# Patient Record
Sex: Female | Born: 1995 | Race: Black or African American | Hispanic: No | Marital: Single | Smoking: Former smoker
Health system: Southern US, Community
[De-identification: ages and names within clinical notes are randomized; demographics above are authoritative.]

## PROBLEM LIST (undated history)

## (undated) DIAGNOSIS — O020 Blighted ovum and nonhydatidiform mole: Secondary | ICD-10-CM

## (undated) DIAGNOSIS — F319 Bipolar disorder, unspecified: Secondary | ICD-10-CM

## (undated) DIAGNOSIS — Z9189 Other specified personal risk factors, not elsewhere classified: Secondary | ICD-10-CM

## (undated) HISTORY — DX: Other specified personal risk factors, not elsewhere classified: Z91.89

---

## 2018-09-26 DIAGNOSIS — S80212A Abrasion, left knee, initial encounter: Secondary | ICD-10-CM | POA: Diagnosis not present

## 2018-09-26 DIAGNOSIS — S80211A Abrasion, right knee, initial encounter: Secondary | ICD-10-CM | POA: Diagnosis not present

## 2018-09-26 DIAGNOSIS — R55 Syncope and collapse: Secondary | ICD-10-CM | POA: Diagnosis not present

## 2018-09-26 DIAGNOSIS — W1839XA Other fall on same level, initial encounter: Secondary | ICD-10-CM | POA: Diagnosis not present

## 2018-09-26 DIAGNOSIS — Z23 Encounter for immunization: Secondary | ICD-10-CM | POA: Diagnosis not present

## 2018-09-26 DIAGNOSIS — Z5321 Procedure and treatment not carried out due to patient leaving prior to being seen by health care provider: Secondary | ICD-10-CM | POA: Diagnosis not present

## 2018-10-14 DIAGNOSIS — Z3202 Encounter for pregnancy test, result negative: Secondary | ICD-10-CM | POA: Diagnosis not present

## 2019-01-20 DIAGNOSIS — Z20822 Contact with and (suspected) exposure to covid-19: Secondary | ICD-10-CM | POA: Diagnosis not present

## 2019-01-20 DIAGNOSIS — Z20828 Contact with and (suspected) exposure to other viral communicable diseases: Secondary | ICD-10-CM | POA: Diagnosis not present

## 2019-09-28 ENCOUNTER — Other Ambulatory Visit: Payer: Self-pay

## 2019-09-28 ENCOUNTER — Encounter: Payer: Self-pay | Admitting: Obstetrics

## 2019-09-28 ENCOUNTER — Ambulatory Visit (INDEPENDENT_AMBULATORY_CARE_PROVIDER_SITE_OTHER): Payer: Federal, State, Local not specified - PPO

## 2019-09-28 DIAGNOSIS — Z3490 Encounter for supervision of normal pregnancy, unspecified, unspecified trimester: Secondary | ICD-10-CM | POA: Insufficient documentation

## 2019-09-28 DIAGNOSIS — Z3480 Encounter for supervision of other normal pregnancy, unspecified trimester: Secondary | ICD-10-CM | POA: Insufficient documentation

## 2019-09-28 DIAGNOSIS — N912 Amenorrhea, unspecified: Secondary | ICD-10-CM

## 2019-09-28 LAB — POCT URINE PREGNANCY: Preg Test, Ur: POSITIVE — AB

## 2019-09-28 MED ORDER — BLOOD PRESSURE MONITOR KIT: 1.0000 | PACK | 0 refills | Status: DC

## 2019-09-28 NOTE — Progress Notes (Signed)
Megan Vance presents today for UPT. She complains of nausea declined Rx . LMP:08/07/2019     OBJECTIVE: Appears well, in no apparent distress.  OB History   No obstetric history on file.    Home UPT Result: Positive took 2 at home UPT's.  In-Office UPT result: Positive  I have reviewed the patient's medical, obstetrical, social, and family histories, and medications.   ASSESSMENT: Positive pregnancy test  PLAN Prenatal care to be completed at: [facility]   B/P Cuff sent  NOB Intake Done

## 2019-09-28 NOTE — Progress Notes (Signed)
Patient was assessed and managed by nursing staff during this encounter. I have reviewed the chart and agree with the documentation and plan. I have also made any necessary editorial changes.  Catalina Antigua, MD 09/28/2019 4:28 PM

## 2019-10-05 ENCOUNTER — Other Ambulatory Visit: Payer: Self-pay

## 2019-10-05 MED ORDER — DOXYLAMINE-PYRIDOXINE 10-10 MG PO TBEC: 2.0000 | DELAYED_RELEASE_TABLET | Freq: Every evening | ORAL | 2 refills | Status: DC | PRN

## 2019-10-20 ENCOUNTER — Encounter: Payer: Medicaid Other | Admitting: Advanced Practice Midwife

## 2019-10-20 ENCOUNTER — Ambulatory Visit (INDEPENDENT_AMBULATORY_CARE_PROVIDER_SITE_OTHER): Payer: Federal, State, Local not specified - PPO | Admitting: Advanced Practice Midwife

## 2019-10-20 ENCOUNTER — Other Ambulatory Visit: Payer: Self-pay

## 2019-10-20 ENCOUNTER — Ambulatory Visit (INDEPENDENT_AMBULATORY_CARE_PROVIDER_SITE_OTHER): Payer: Federal, State, Local not specified - PPO

## 2019-10-20 VITALS — BP 127/82 | HR 86 | Wt 136.6 lb

## 2019-10-20 DIAGNOSIS — O3680X0 Pregnancy with inconclusive fetal viability, not applicable or unspecified: Secondary | ICD-10-CM | POA: Diagnosis not present

## 2019-10-20 DIAGNOSIS — O26851 Spotting complicating pregnancy, first trimester: Secondary | ICD-10-CM

## 2019-10-20 DIAGNOSIS — O3680X1 Pregnancy with inconclusive fetal viability, fetus 1: Secondary | ICD-10-CM | POA: Diagnosis not present

## 2019-10-20 DIAGNOSIS — Z3A10 10 weeks gestation of pregnancy: Secondary | ICD-10-CM

## 2019-10-20 DIAGNOSIS — O0990 Supervision of high risk pregnancy, unspecified, unspecified trimester: Secondary | ICD-10-CM | POA: Insufficient documentation

## 2019-10-20 DIAGNOSIS — O0991 Supervision of high risk pregnancy, unspecified, first trimester: Secondary | ICD-10-CM

## 2019-10-20 DIAGNOSIS — O283 Abnormal ultrasonic finding on antenatal screening of mother: Secondary | ICD-10-CM | POA: Diagnosis not present

## 2019-10-20 DIAGNOSIS — O368390 Maternal care for abnormalities of the fetal heart rate or rhythm, unspecified trimester, not applicable or unspecified: Secondary | ICD-10-CM

## 2019-10-20 DIAGNOSIS — O368391 Maternal care for abnormalities of the fetal heart rate or rhythm, unspecified trimester, fetus 1: Secondary | ICD-10-CM

## 2019-10-20 DIAGNOSIS — F340 Cyclothymic disorder: Secondary | ICD-10-CM

## 2019-10-20 DIAGNOSIS — Z3490 Encounter for supervision of normal pregnancy, unspecified, unspecified trimester: Secondary | ICD-10-CM

## 2019-10-20 NOTE — Progress Notes (Signed)
    PRENATAL VISIT NOTE  Subjective:  Megan Vance is a 24 y.o. G2P0 at [redacted]w[redacted]d being seen today for initial prenatal care.  She is currently monitored for the following issues for this high-risk pregnancy and has Supervision of normal pregnancy, antepartum on their problem list.  Patient reports light spotting x 2-3 days.   . Vag. Bleeding: Scant.  Movement: Absent. Denies leaking of fluid.   The following portions of the patient's history were reviewed and updated as appropriate: allergies, current medications, past family history, past medical history, past social history, past surgical history and problem list.   Objective:   Vitals:   10/20/19 1538  BP: 127/82  Pulse: 86  Weight: 136 lb 9.6 oz (62 kg)    Fetal Status: Fetal Heart Rate (bpm): U/S   Movement: Absent     General:  Alert, oriented and cooperative. Patient is in no acute distress.  Skin: Skin is warm and dry. No rash noted.   Cardiovascular: Normal heart rate noted  Respiratory: Normal respiratory effort, no problems with respiration noted  Abdomen: Soft, gravid, appropriate for gestational age.  Pain/Pressure: Absent     Pelvic: Cervical exam deferred        Extremities: Normal range of motion.  Edema: None  Mental Status: Normal mood and affect. Normal behavior. Normal judgment and thought content.   Assessment and Plan:  Pregnancy: G2P0 at [redacted]w[redacted]d 1. Encounter to determine fetal viability of pregnancy, single or unspecified fetus - US OB Limited; Future - CBC - Comprehensive metabolic panel - Beta hCG quant (ref lab)  2. Unable to hear fetal heart tones as reason for ultrasound scan - US OB Limited; Future  3. Abnormal fetal ultrasound --Unable to hear FHT so US performed in office. --Multiple cystic structures without evidence of viable IUP noted. Dr Gerri Spore to bedside during [place].  Likely molar pregnancy, follow up US to confirm. - US OB LESS THAN 14 WEEKS WITH OB TRANSVAGINAL; Future  5. Supervision of  high risk pregnancy, antepartum --Pt to f/u with [place] and appt in office with MD to review [place], make plan for treatment. --Questions answered.  6. Bipolar disorder, current condition not specified as either manic or depressive --Pt reports bipolar diagnosed as a child, and has not required meds for many years. But during pregnancy, her symptoms have worsened and she has missed work.  She is seeking help today and wants to speak with our counselor and better manage her symptoms. --I will support her missed work since the early part of the pregnancy and will sign for her to be out until her appt with MD next week and decisions are made about the pregnancy and treatment.   --PHQ9 was not performed as patient reported spotting and was sent to [place] before complete assessment.  After likely molar pregnancy was diagnosed, she did not complete full New OB visit today.  She did deny any risks of harm to herself or others. She denies any suicidal thoughts.  - Ambulatory referral to Integrated Behavioral Health  Miscarriage precautions and general obstetric precautions including but not limited to vaginal bleeding, contractions, leaking of fluid and fetal movement were reviewed in detail with the patient. Please refer to After Visit Summary for other counseling recommendations.   No follow-ups on file.  Future Appointments  Date Time Provider Department Center  10/21/2019  9:00 AM [facility]  10/26/2019  1:00 PM Gwyndolyn Saxon, LCSW [facility] None    Sharen Counter, CNM

## 2019-10-20 NOTE — Progress Notes (Signed)
NOB  NOB Intake completed 09/28/19 Last GTX:MIWO than 3 yrs no hx of abnormal   Genetic Screening:Desires and wants to know gender  EH:[id number] for a few days pt states blood is bright red.Went and discussed in office U/S w/ RN and provider to be done immediately   Pt needs to discuss accomodation paper work brought forms today. Pt has been home a few days due to personal stressors. Pt notes being Bi-polar and lately annoyed and frustrated and would like to be able to have resources of someone to speak to regarding this.Did not get to do depression screening due to getting pt to U/S instead.   Needs Rx for Nausea.

## 2019-10-21 ENCOUNTER — Ambulatory Visit
Admission: RE | Admit: 2019-10-21 | Discharge: 2019-10-21 | Disposition: A | Discharge status: Home / Self Care | Payer: Federal, State, Local not specified - PPO | Source: Ambulatory Visit | Attending: Advanced Practice Midwife | Admitting: Advanced Practice Midwife

## 2019-10-21 ENCOUNTER — Telehealth: Payer: Self-pay | Admitting: Advanced Practice Midwife

## 2019-10-21 DIAGNOSIS — N858 Other specified noninflammatory disorders of uterus: Secondary | ICD-10-CM | POA: Diagnosis not present

## 2019-10-21 DIAGNOSIS — Z3A00 Weeks of gestation of pregnancy not specified: Secondary | ICD-10-CM | POA: Diagnosis not present

## 2019-10-21 DIAGNOSIS — O283 Abnormal ultrasonic finding on antenatal screening of mother: Secondary | ICD-10-CM | POA: Diagnosis not present

## 2019-10-21 DIAGNOSIS — O26851 Spotting complicating pregnancy, first trimester: Secondary | ICD-10-CM | POA: Diagnosis not present

## 2019-10-21 DIAGNOSIS — R9389 Abnormal findings on diagnostic imaging of other specified body structures: Secondary | ICD-10-CM | POA: Diagnosis not present

## 2019-10-21 LAB — CBC
Hematocrit: 38.6 % (ref 34.0–46.6)
Hemoglobin: 12.9 g/dL (ref 11.1–15.9)
MCH: 30.7 pg (ref 26.6–33.0)
MCHC: 33.4 g/dL (ref 31.5–35.7)
MCV: 92 fL (ref 79–97)
Platelets: 223 10*3/uL (ref 150–450)
RBC: 4.2 x10E6/uL (ref 3.77–5.28)
RDW: 12.9 % (ref 11.7–15.4)
WBC: 8.6 10*3/uL (ref 3.4–10.8)

## 2019-10-21 LAB — COMPREHENSIVE METABOLIC PANEL
ALT: 38 IU/L — ABNORMAL HIGH (ref 0–32)
AST: 37 IU/L (ref 0–40)
Albumin/Globulin Ratio: 1.9 (ref 1.2–2.2)
Albumin: 4.6 g/dL (ref 3.9–5.0)
Alkaline Phosphatase: 50 IU/L (ref 44–121)
BUN/Creatinine Ratio: 7 — ABNORMAL LOW (ref 9–23)
BUN: 5 mg/dL — ABNORMAL LOW (ref 6–20)
Bilirubin Total: 0.2 mg/dL (ref 0.0–1.2)
CO2: 20 mmol/L (ref 20–29)
Calcium: 10 mg/dL (ref 8.7–10.2)
Chloride: 103 mmol/L (ref 96–106)
Creatinine, Ser: 0.67 mg/dL (ref 0.57–1.00)
GFR calc Af Amer: 142 mL/min/{1.73_m2} (ref >59)
GFR calc non Af Amer: 123 mL/min/{1.73_m2} (ref >59)
Globulin, Total: 2.4 g/dL (ref 1.5–4.5)
Glucose: 96 mg/dL (ref 65–99)
Potassium: 4 mmol/L (ref 3.5–5.2)
Sodium: 137 mmol/L (ref 134–144)
Total Protein: 7 g/dL (ref 6.0–8.5)

## 2019-10-21 LAB — BETA HCG QUANT (REF LAB): hCG Quant: 271571 m[IU]/mL

## 2019-10-21 NOTE — Telephone Encounter (Signed)
Called pt to review [place] results from 10/21/19 confirming molar pregnancy.  Dr Gerri Spore saw pt in office on 10/5 and discussed likely diagnosis and need for surgical management. Pt was given opportunity to ask questions at that time.  Appt with MD was made for next available date, which is 10/26/19 with Dr Debroah Loop.  Consult with Drs Debroah Loop and Constant, who recommend discussing results with pt via telephone and scheduling D&E procedure now, instead of waiting until appt 10/11 to schedule.    Discussed US findings with patient via telephone today and questions answered.  Pt agrees with plan of care to schedule surgical procedure. She will keep appt with Dr Debroah Loop on 10/26/19 if surgery is scheduled after that date.  If pt has any further questions, she was instructed to contact [facility] office.  Bleeding/emergency precautions/reasons to go to MAU were reviewed.

## 2019-10-26 ENCOUNTER — Telehealth: Payer: Self-pay

## 2019-10-26 ENCOUNTER — Other Ambulatory Visit: Payer: Self-pay | Admitting: Obstetrics & Gynecology

## 2019-10-26 ENCOUNTER — Encounter (HOSPITAL_BASED_OUTPATIENT_CLINIC_OR_DEPARTMENT_OTHER): Payer: Self-pay | Admitting: Obstetrics & Gynecology

## 2019-10-26 ENCOUNTER — Ambulatory Visit (INDEPENDENT_AMBULATORY_CARE_PROVIDER_SITE_OTHER): Payer: Federal, State, Local not specified - PPO | Admitting: Licensed Clinical Social Worker

## 2019-10-26 ENCOUNTER — Other Ambulatory Visit (HOSPITAL_COMMUNITY): Payer: Self-pay | Admitting: Obstetrics & Gynecology

## 2019-10-26 DIAGNOSIS — Z8659 Personal history of other mental and behavioral disorders: Secondary | ICD-10-CM | POA: Diagnosis not present

## 2019-10-26 DIAGNOSIS — O020 Blighted ovum and nonhydatidiform mole: Secondary | ICD-10-CM

## 2019-10-26 NOTE — BH Specialist Note (Signed)
Integrated Behavioral Health via Telemedicine Video (Caregility) Visit  10/26/2019 Joylyn Duggin [id number]  Number of Integrated Behavioral Health visits: 1 Session Start time: 1:05pm  Session End time: 1:42pm Total time: 37 minutes via mychart   Referring Provider: Sharen Counter CNM Type of Service: Individual Patient/Family location: Home  [facility] Provider location: [facility]  All persons participating in visit: Pt Q. Homero Fellers and LCSWA A. Felton Clinton    I connected with Victorino Dike and/or Laverta Baltimore n/a by a video enabled telemedicine application (Caregility) and verified that I am speaking with the correct person using two identifiers.   Discussed confidentiality: yes  Confirmed demographics & insurance:  no  I discussed that engaging in this virtual visit, they consent to the provision of behavioral healthcare and the services will be billed under their insurance.   Patient and/or legal guardian expressed understanding and consented to virtual visit: yes  PRESENTING CONCERNS: Patient and/or family reports the following symptoms/concerns: history or bipolar depression and anxiety Duration of problem: approx four months; Severity of problem: mild   STRENGTHS (Protective Factors/Coping Skills): Ms. Fehrenbach is employed full time   ASSESSMENT: Patient currently experiencing depression    GOALS ADDRESSED: Patient will: 1.  Reduce symptoms of: depression 2.  Increase knowledge and/or ability of:  Implement coping skills  3.  Demonstrate ability to: self manage symptoms      INTERVENTIONS: Interventions utilized:  Supportive counseling  Standardized Assessments completed & reviewed: n/a     PLAN: 1. Follow up with behavioral health clinician on : three weeks via mychart  2. Behavioral recommendations: develop a self care plan, practice mindfulness technique three days a week for minimum of 20 mins .  3. Referral(s): n/a   I discussed the assessment and treatment plan  with the patient and/or parent/guardian. They were provided an opportunity to ask questions and all were answered. They agreed with the plan and demonstrated an understanding of the instructions.   They were advised to call back or seek an in-person evaluation as appropriate.  I discussed that the purpose of this visit is to provide behavioral health care while limiting exposure to the novel coronavirus.  Discussed there is a possibility of technology failure and discussed alternative modes of communication if that failure occurs.  Gwyndolyn Saxon

## 2019-10-26 NOTE — Telephone Encounter (Signed)
Called and spoke with Ms. Megan Vance. Surgery date time and location given. Pre-op instructions given  Surgery 10/28/2019 at 10:00am, arrive at 8:30am at [facility]. NPO after midnight, no lotions, no powder, no perfume, little bit of deodorant okay. Advised to removed all jewelry. Expressed concern about tongue and nose ring, advised to remove them prior to surgery due to the use on anesthesia.  Covid testing instructions given. Arrive [facility] tomorrow between 9a-3p.  Patient expressed understanding, questions answered. Advised to call our office should the need arises.

## 2019-10-27 ENCOUNTER — Other Ambulatory Visit: Payer: Self-pay

## 2019-10-27 ENCOUNTER — Encounter (HOSPITAL_BASED_OUTPATIENT_CLINIC_OR_DEPARTMENT_OTHER): Payer: Self-pay | Admitting: Obstetrics & Gynecology

## 2019-10-27 ENCOUNTER — Other Ambulatory Visit (HOSPITAL_COMMUNITY)
Admission: RE | Admit: 2019-10-27 | Discharge: 2019-10-27 | Disposition: A | Discharge status: Home / Self Care | Payer: Federal, State, Local not specified - PPO | Source: Ambulatory Visit | Attending: Obstetrics & Gynecology | Admitting: Obstetrics & Gynecology

## 2019-10-27 DIAGNOSIS — Z01812 Encounter for preprocedural laboratory examination: Secondary | ICD-10-CM | POA: Diagnosis not present

## 2019-10-27 DIAGNOSIS — Z20822 Contact with and (suspected) exposure to covid-19: Secondary | ICD-10-CM | POA: Diagnosis not present

## 2019-10-27 LAB — SARS CORONAVIRUS 2 (TAT 6-24 HRS): SARS Coronavirus 2: NEGATIVE

## 2019-10-28 ENCOUNTER — Telehealth: Payer: Federal, State, Local not specified - PPO | Admitting: Obstetrics & Gynecology

## 2019-10-28 ENCOUNTER — Other Ambulatory Visit: Payer: Self-pay

## 2019-10-28 ENCOUNTER — Encounter (HOSPITAL_BASED_OUTPATIENT_CLINIC_OR_DEPARTMENT_OTHER)
Admission: RE | Disposition: A | Discharge status: Home / Self Care | Payer: Self-pay | Source: Home / Self Care | Attending: Obstetrics & Gynecology

## 2019-10-28 ENCOUNTER — Ambulatory Visit (HOSPITAL_BASED_OUTPATIENT_CLINIC_OR_DEPARTMENT_OTHER): Payer: Federal, State, Local not specified - PPO | Admitting: Anesthesiology

## 2019-10-28 ENCOUNTER — Ambulatory Visit (HOSPITAL_COMMUNITY)
Admission: RE | Admit: 2019-10-28 | Discharge: 2019-10-28 | Disposition: A | Discharge status: Home / Self Care | Payer: Federal, State, Local not specified - PPO | Source: Ambulatory Visit | Attending: Obstetrics & Gynecology | Admitting: Obstetrics & Gynecology

## 2019-10-28 ENCOUNTER — Encounter (HOSPITAL_BASED_OUTPATIENT_CLINIC_OR_DEPARTMENT_OTHER): Payer: Self-pay | Admitting: Obstetrics & Gynecology

## 2019-10-28 ENCOUNTER — Ambulatory Visit (HOSPITAL_BASED_OUTPATIENT_CLINIC_OR_DEPARTMENT_OTHER)
Admission: RE | Admit: 2019-10-28 | Discharge: 2019-10-28 | Disposition: A | Discharge status: Home / Self Care | Payer: Federal, State, Local not specified - PPO | Attending: Obstetrics & Gynecology | Admitting: Obstetrics & Gynecology

## 2019-10-28 DIAGNOSIS — O020 Blighted ovum and nonhydatidiform mole: Secondary | ICD-10-CM

## 2019-10-28 DIAGNOSIS — Z3A11 11 weeks gestation of pregnancy: Secondary | ICD-10-CM | POA: Insufficient documentation

## 2019-10-28 DIAGNOSIS — O019 Hydatidiform mole, unspecified: Secondary | ICD-10-CM | POA: Diagnosis not present

## 2019-10-28 DIAGNOSIS — Z87891 Personal history of nicotine dependence: Secondary | ICD-10-CM | POA: Diagnosis not present

## 2019-10-28 HISTORY — DX: Bipolar disorder, unspecified: F31.9

## 2019-10-28 HISTORY — PX: OPERATIVE ULTRASOUND: SHX5996

## 2019-10-28 HISTORY — DX: Blighted ovum and nonhydatidiform mole: O02.0

## 2019-10-28 HISTORY — PX: DILATION AND EVACUATION: SHX1459

## 2019-10-28 LAB — POCT HEMOGLOBIN-HEMACUE: Hemoglobin: 10.8 g/dL — ABNORMAL LOW (ref 12.0–15.0)

## 2019-10-28 LAB — CBC
HCT: 32.4 % — ABNORMAL LOW (ref 36.0–46.0)
Hemoglobin: 10.7 g/dL — ABNORMAL LOW (ref 12.0–15.0)
MCH: 31.4 pg (ref 26.0–34.0)
MCHC: 33 g/dL (ref 30.0–36.0)
MCV: 95 fL (ref 80.0–100.0)
Platelets: 196 10*3/uL (ref 150–400)
RBC: 3.41 MIL/uL — ABNORMAL LOW (ref 3.87–5.11)
RDW: 13.5 % (ref 11.5–15.5)
WBC: 6.6 10*3/uL (ref 4.0–10.5)
nRBC: 0 % (ref 0.0–0.2)

## 2019-10-28 LAB — TYPE AND SCREEN
ABO/RH(D): O POS
Antibody Screen: NEGATIVE

## 2019-10-28 SURGERY — DILATION AND EVACUATION, UTERUS
Anesthesia: General | Site: Uterus

## 2019-10-28 MED ORDER — DOXYCYCLINE HYCLATE 100 MG IV SOLR
200.0000 mg | INTRAVENOUS | Status: AC
  Administered 2019-10-28: 200 mg via INTRAVENOUS
  Filled 2019-10-28: qty 200

## 2019-10-28 MED ORDER — MIDAZOLAM HCL 2 MG/2ML IJ SOLN
INTRAMUSCULAR | Status: AC
  Filled 2019-10-28: qty 2

## 2019-10-28 MED ORDER — ONDANSETRON HCL 4 MG/2ML IJ SOLN: 4.0000 mg | Freq: Once | INTRAMUSCULAR | Status: DC | PRN

## 2019-10-28 MED ORDER — DEXAMETHASONE SODIUM PHOSPHATE 10 MG/ML IJ SOLN
INTRAMUSCULAR | Status: AC
  Filled 2019-10-28: qty 1

## 2019-10-28 MED ORDER — MIDAZOLAM HCL 5 MG/5ML IJ SOLN
INTRAMUSCULAR | Status: DC | PRN
  Administered 2019-10-28: 2 mg via INTRAVENOUS

## 2019-10-28 MED ORDER — LIDOCAINE 2% (20 MG/ML) 5 ML SYRINGE
INTRAMUSCULAR | Status: AC
  Filled 2019-10-28: qty 5

## 2019-10-28 MED ORDER — DEXAMETHASONE SODIUM PHOSPHATE 10 MG/ML IJ SOLN
INTRAMUSCULAR | Status: DC | PRN
  Administered 2019-10-28: 10 mg via INTRAVENOUS

## 2019-10-28 MED ORDER — FENTANYL CITRATE (PF) 100 MCG/2ML IJ SOLN
INTRAMUSCULAR | Status: AC
  Filled 2019-10-28: qty 2

## 2019-10-28 MED ORDER — ACETAMINOPHEN 160 MG/5ML PO SOLN: 325.0000 mg | ORAL | Status: DC | PRN

## 2019-10-28 MED ORDER — OXYCODONE HCL 5 MG PO TABS: 5.0000 mg | ORAL_TABLET | Freq: Once | ORAL | Status: DC | PRN

## 2019-10-28 MED ORDER — OXYCODONE-ACETAMINOPHEN 5-325 MG PO TABS: 1.0000 | ORAL_TABLET | Freq: Four times a day (QID) | ORAL | 0 refills | Status: DC | PRN

## 2019-10-28 MED ORDER — MEPERIDINE HCL 25 MG/ML IJ SOLN: 6.2500 mg | INTRAMUSCULAR | Status: DC | PRN

## 2019-10-28 MED ORDER — FENTANYL CITRATE (PF) 100 MCG/2ML IJ SOLN
INTRAMUSCULAR | Status: DC | PRN
Start: 2019-10-28 — End: 2019-10-28
  Administered 2019-10-28: 25 ug via INTRAVENOUS
  Administered 2019-10-28: 50 ug via INTRAVENOUS
  Administered 2019-10-28: 25 ug via INTRAVENOUS

## 2019-10-28 MED ORDER — ONDANSETRON HCL 4 MG/2ML IJ SOLN
INTRAMUSCULAR | Status: DC | PRN
  Administered 2019-10-28: 4 mg via INTRAVENOUS

## 2019-10-28 MED ORDER — PROPOFOL 10 MG/ML IV BOLUS
INTRAVENOUS | Status: AC
  Filled 2019-10-28: qty 20

## 2019-10-28 MED ORDER — POVIDONE-IODINE 10 % EX SWAB: 2.0000 "application " | Freq: Once | CUTANEOUS | Status: DC

## 2019-10-28 MED ORDER — FENTANYL CITRATE (PF) 100 MCG/2ML IJ SOLN
25.0000 ug | INTRAMUSCULAR | Status: DC | PRN
  Administered 2019-10-28 (×2): 50 ug via INTRAVENOUS

## 2019-10-28 MED ORDER — LIDOCAINE HCL (CARDIAC) PF 100 MG/5ML IV SOSY
PREFILLED_SYRINGE | INTRAVENOUS | Status: DC | PRN
  Administered 2019-10-28: 60 mg via INTRATRACHEAL

## 2019-10-28 MED ORDER — BUPIVACAINE HCL (PF) 0.25 % IJ SOLN
INTRAMUSCULAR | Status: AC
  Filled 2019-10-28: qty 30

## 2019-10-28 MED ORDER — EPHEDRINE SULFATE 50 MG/ML IJ SOLN
INTRAMUSCULAR | Status: DC | PRN
  Administered 2019-10-28: 10 mg via INTRAVENOUS

## 2019-10-28 MED ORDER — ACETAMINOPHEN 325 MG PO TABS: 325.0000 mg | ORAL_TABLET | ORAL | Status: DC | PRN

## 2019-10-28 MED ORDER — ONDANSETRON HCL 4 MG/2ML IJ SOLN
INTRAMUSCULAR | Status: AC
  Filled 2019-10-28: qty 2

## 2019-10-28 MED ORDER — OXYTOCIN 10 UNIT/ML IJ SOLN
INTRAMUSCULAR | Status: AC
  Filled 2019-10-28: qty 2

## 2019-10-28 MED ORDER — OXYCODONE HCL 5 MG/5ML PO SOLN: 5.0000 mg | Freq: Once | ORAL | Status: DC | PRN

## 2019-10-28 MED ORDER — LACTATED RINGERS IV SOLN: INTRAVENOUS | Status: DC

## 2019-10-28 MED ORDER — FERROUS SULFATE 325 (65 FE) MG PO TABS: 325.0000 mg | ORAL_TABLET | Freq: Every day | ORAL | 3 refills | Status: DC

## 2019-10-28 MED ORDER — PROPOFOL 10 MG/ML IV BOLUS
INTRAVENOUS | Status: DC | PRN
  Administered 2019-10-28: 50 mg via INTRAVENOUS
  Administered 2019-10-28: 150 mg via INTRAVENOUS

## 2019-10-28 MED ORDER — FENTANYL CITRATE (PF) 100 MCG/2ML IJ SOLN
INTRAMUSCULAR | Status: AC
Start: 2019-10-28 — End: ?
  Filled 2019-10-28: qty 2

## 2019-10-28 MED ORDER — OXYTOCIN 10 UNIT/ML IJ SOLN
INTRAMUSCULAR | Status: DC | PRN
  Administered 2019-10-28: 20 [IU] via INTRAMUSCULAR

## 2019-10-28 MED ORDER — SODIUM CHLORIDE 0.9 % IV SOLN
INTRAVENOUS | Status: AC
  Filled 2019-10-28 (×2): qty 100

## 2019-10-28 MED ORDER — BUPIVACAINE HCL (PF) 0.25 % IJ SOLN
INTRAMUSCULAR | Status: DC | PRN
  Administered 2019-10-28: 10 mL

## 2019-10-28 SURGICAL SUPPLY — 24 items
CATH ROBINSON RED A/P 14FR (CATHETERS) IMPLANT
DECANTER SPIKE VIAL GLASS SM (MISCELLANEOUS) IMPLANT
FILTER UTR ASPR ASSEMBLY (MISCELLANEOUS) IMPLANT
GAUZE 4X4 16PLY RFD (DISPOSABLE) ×2 IMPLANT
GLOVE BIO SURGEON STRL SZ 6.5 (GLOVE) ×2 IMPLANT
GLOVE BIOGEL PI IND STRL 7.0 (GLOVE) ×2 IMPLANT
GLOVE BIOGEL PI INDICATOR 7.0 (GLOVE) ×2
GOWN STRL REUS W/ TWL LRG LVL3 (GOWN DISPOSABLE) ×1 IMPLANT
GOWN STRL REUS W/TWL LRG LVL3 (GOWN DISPOSABLE) ×5 IMPLANT
HIBICLENS CHG 4% 4OZ BTL (MISCELLANEOUS) IMPLANT
HOSE CONNECTING 18IN BERKELEY (TUBING) IMPLANT
KIT BERKELEY 1ST TRI 3/8 NO TR (MISCELLANEOUS) ×2 IMPLANT
KIT BERKELEY 1ST TRIMESTER 3/8 (MISCELLANEOUS) ×2 IMPLANT
NS IRRIG 1000ML POUR BTL (IV SOLUTION) ×2 IMPLANT
PACK VAGINAL MINOR WOMEN LF (CUSTOM PROCEDURE TRAY) ×2 IMPLANT
PAD OB MATERNITY 4.3X12.25 (PERSONAL CARE ITEMS) ×2 IMPLANT
PAD PREP 24X48 CUFFED NSTRL (MISCELLANEOUS) ×2 IMPLANT
SET BERKELEY SUCTION TUBING (SUCTIONS) ×2 IMPLANT
SLEEVE SCD COMPRESS KNEE MED (MISCELLANEOUS) ×2 IMPLANT
TOWEL GREEN STERILE FF (TOWEL DISPOSABLE) ×2 IMPLANT
VACURETTE 10 RIGID CVD (CANNULA) ×2 IMPLANT
VACURETTE 7MM CVD STRL WRAP (CANNULA) IMPLANT
VACURETTE 8 RIGID CVD (CANNULA) IMPLANT
VACURETTE 9 RIGID CVD (CANNULA) IMPLANT

## 2019-10-28 NOTE — Anesthesia Postprocedure Evaluation (Signed)
Anesthesia Post Note  Patient: Megan Vance  Procedure(s) Performed: DILATATION AND EVACUATION (N/A Uterus) OPERATIVE ULTRASOUND (N/A Uterus)     Patient location during evaluation: PACU Anesthesia Type: General Level of consciousness: awake and alert Pain management: pain level controlled Vital Signs Assessment: post-procedure vital signs reviewed and stable Respiratory status: spontaneous breathing, nonlabored ventilation, respiratory function stable and patient connected to nasal cannula oxygen Cardiovascular status: blood pressure returned to baseline and stable Postop Assessment: no apparent nausea or vomiting Anesthetic complications: no   No complications documented.  Last Vitals:  Vitals:   10/28/19 1250 10/28/19 1302  BP:  110/69  Pulse: (!) 59 64  Resp: 19 20  Temp:  36.6 C  SpO2: 100% 100%    Last Pain:  Vitals:   10/28/19 1302  TempSrc: Oral  PainSc: 0-No pain                 Wilborn Membreno

## 2019-10-28 NOTE — Transfer of Care (Signed)
Immediate Anesthesia Transfer of Care Note  Patient: Megan Vance  Procedure(s) Performed: DILATATION AND EVACUATION (N/A Uterus) OPERATIVE ULTRASOUND (N/A Uterus)  Patient Location: PACU  Anesthesia Type:General  Level of Consciousness: drowsy, patient cooperative and responds to stimulation  Airway & Oxygen Therapy: Patient Spontanous Breathing and Patient connected to face mask oxygen  Post-op Assessment: Report given to RN and Post -op Vital signs reviewed and stable  Post vital signs: Reviewed and stable  Last Vitals:  Vitals Value Taken Time  BP 133/68 10/28/19 1111  Temp    Pulse 99 10/28/19 1114  Resp 22 10/28/19 1114  SpO2 100 % 10/28/19 1114  Vitals shown include unvalidated device data.  Last Pain:  Vitals:   10/28/19 0945  TempSrc: Oral  PainSc: 0-No pain      Patients Stated Pain Goal: 4 (10/28/19 0945)  Complications: No complications documented.

## 2019-10-28 NOTE — Op Note (Signed)
Megan Vance PROCEDURE DATE: 10/28/2019  PREOPERATIVE DIAGNOSIS: [redacted]w[redacted]d Molar pregnancy POSTOPERATIVE DIAGNOSIS: The same PROCEDURE:     Dilation and Evacuation SURGEON:  Scheryl Darter MD  INDICATIONS: 24 y.o. G2P0 with molar pregnancy at [redacted]w[redacted]d weeks gestation, needing surgical completion.  Risks of surgery were discussed with the patient including but not limited to: bleeding which may require transfusion; infection which may require antibiotics; injury to uterus or surrounding organs; need for additional procedures including laparotomy or laparoscopy; possibility of intrauterine scarring which may impair future fertility; and other postoperative/anesthesia complications. Written informed consent was obtained.    FINDINGS:  A 10 week size uterus, moderate amounts of products of conception, specimen sent to pathology.  ANESTHESIA:    Monitored intravenous sedation, paracervical block. INTRAVENOUS FLUIDS:1000 ml of LR ESTIMATED BLOOD LOSS:  900 ml. SPECIMENS:  Products of conception sent to pathology COMPLICATIONS:  None immediate.  PROCEDURE DETAILS:  The patient received intravenous Doxycycline while in the preoperative area.  She was then taken to the operating room where monitored intravenous sedation was administered and was found to be adequate.  After an adequate timeout was performed, she was placed in the dorsal lithotomy position and examined; then prepped and draped in the sterile manner.   Her bladder was catheterized for an unmeasured amount of clear, yellow urine. A vaginal speculum was then placed in the patient's vagina and a single tooth tenaculum was applied to the anterior lip of the cervix.  A paracervical block using 10 ml of 0.25% Marcaine was administered. The cervix was gently dilated to accommodate a 10 mm suction curette that was gently advanced to the uterine fundus.  The suction device was then activated and curette slowly rotated to clear the uterus of products of  conception. Ultrasound imaging was used intraoperatively to assure evacuation of the cavity . There was minimal bleeding noted and the tenaculum removed with good hemostasis noted.   All instruments were removed from the patient's vagina. During curettage significant blood loss was noted but was minimal at the end. Sponge and instrument counts were correct times two  The patient tolerated the procedure well and was taken to the recovery area awake, and in stable condition.   Adam Phenix, MD 10/28/2019 11:19 AM

## 2019-10-28 NOTE — H&P (Signed)
Megan Vance is an 24 y.o. female. G2P0 Patient's last menstrual period was 08/07/2019. [redacted]w[redacted]d Patient is scheduled for dilation and evacuation of suspected molar pregnancy, as diagnosed by [place].     Menstrual History:  Patient's last menstrual period was 08/07/2019.    Past Medical History:  Diagnosis Date  . Bipolar affective disorder (HCC)   . History of fainting    since age 59   . Molar pregnancy    OB History    Gravida  2   Para      Term      Preterm      AB      Living        SAB      TAB      Ectopic      Multiple      Live Births              History reviewed. No pertinent surgical history.  Family History  Problem Relation Age of Onset  . Kidney failure Mother   . Heart attack Mother   . Heart failure Maternal Grandmother     Social History:  reports that she has quit smoking. She has never used smokeless tobacco. She reports previous alcohol use. She reports previous drug use.  Allergies: No Known Allergies  No medications prior to admission.    Review of Systems  Constitutional: Negative.   Respiratory: Negative.   Genitourinary: Positive for pelvic pain (few cramps) and vaginal bleeding (spotting).  Psychiatric/Behavioral: Positive for agitation. The patient is nervous/anxious.     Last menstrual period 08/07/2019. Physical Exam Constitutional:      Appearance: Normal appearance.  HENT:     Head: Normocephalic.  Cardiovascular:     Rate and Rhythm: Normal rate.  Pulmonary:     Effort: Pulmonary effort is normal.  Abdominal:     General: There is no distension.  Musculoskeletal:     Cervical back: Neck supple.  Skin:    General: Skin is warm and dry.  Neurological:     Mental Status: She is alert.  Psychiatric:     Comments: anxious     Results for orders placed or performed during the hospital encounter of 10/27/19 (from the past 24 hour(s))  SARS CORONAVIRUS 2 (TAT 6-24 HRS) Nasopharyngeal Nasopharyngeal Swab      Status: None   Collection Time: 10/27/19 12:54 PM   Specimen: Nasopharyngeal Swab  Result Value Ref Range   SARS Coronavirus 2 NEGATIVE NEGATIVE   CLINICAL DATA:  Vaginal spotting with markedly high beta HCG  EXAM: OBSTETRIC <14 WK [place] AND TRANSVAGINAL OB US  TECHNIQUE: Both transabdominal and transvaginal ultrasound examinations were performed for complete evaluation of the gestation as well as the maternal uterus, adnexal regions, and pelvic cul-de-sac. Transvaginal technique was performed to assess early pregnancy.  COMPARISON:  None.  FINDINGS: Intrauterine gestational sac: Not visualized  Yolk sac:  Not visualized  Embryo:  Not visualized  Cardiac Activity: Not visualized  Subchorionic hemorrhage:  None visualized.  Maternal uterus/adnexae: The endometrium is filled with inhomogeneous material with multiple small cystic areas throughout the endometrium. The endometrium measures 7.4 x 4.2 x 7.4 cm. There is increased vascularity along the periphery of this complex material expanding in filling the endometrium.  Right ovary measures 5.8 x 2.3 x 4.2 cm. Left ovary measures 3.8 x 2.6 x 1.9 cm. There are follicles in each ovary with a dominant follicle in the right ovary measuring 2.0 x 1.4 x  1.4 cm. Small amount of free pelvic fluid noted.  IMPRESSION: No intrauterine gestation. Marked thickening of the endometrium with complex material including multiple small cysts throughout the endometrium. This appearance raises concern for molar pregnancy/gestational trophoblastic disease. Appropriate clinical assessment in this regard advised.  No extrauterine pelvic or adnexal mass beyond dominant follicle right ovary. Small amount of free fluid in pelvis which could indicate recent ovarian cyst leakage or rupture.  These results will be called to the ordering clinician or representative by the Radiologist Assistant, and communication documented in the  PACS or [employer].   Electronically Signed   By: Bretta Bang III M.D.   On: 10/21/2019 10:04  CBC    Component Value Date/Time   WBC 8.6 10/20/2019 1634   RBC 4.20 10/20/2019 1634   HGB 12.9 10/20/2019 1634   HCT 38.6 10/20/2019 1634   PLT 223 10/20/2019 1634   MCV 92 10/20/2019 1634   MCH 30.7 10/20/2019 1634   MCHC 33.4 10/20/2019 1634   RDW 12.9 10/20/2019 1634   Results for KELIN, NIXON (MRN [id number]) as of 10/28/2019 09:03  Ref. Range 10/20/2019 16:34  hCG Quant Latest Units: mIU/mL 177,939    Assessment/Plan: Molar pregnancy is highly likely and pregnancy is not viable. Suction D&E with US guidance is scheduled.  The risks of surgery were discussed in detail with the patient including but not limited to: bleeding which may require transfusion or reoperation; infection which may require prolonged hospitalization or re-hospitalization and antibiotic therapy; injury to bowel, bladder, ureters and major vessels or other surrounding organs; formation of adhesions; need for additional procedures including laparotomy or subsequent procedures secondary to abnormal pathology; thromboembolic phenomenon; incisional problems and other postoperative or anesthesia complications.  Patient was told that the likelihood that her condition and symptoms will be treated effectively with this surgical management was very high; the postoperative expectations were also discussed in detail. The patient also understands the alternative treatment options which were discussed in full. All questions were answered.   Scheryl Darter 10/28/2019, 9:03 AM

## 2019-10-28 NOTE — Discharge Instructions (Addendum)
Dilation and Curettage or Vacuum Curettage, Care After These instructions give you information about caring for yourself after your procedure. Your doctor may also give you more specific instructions. Call your doctor if you have any problems or questions after your procedure. Follow these instructions at home: Activity  Do not drive or use heavy machinery while taking prescription pain medicine.  For 24 hours after your procedure, avoid driving.  Take short walks often, followed by rest periods. Ask your doctor what activities are safe for you. After one or two days, you may be able to return to your normal activities.  Do not lift anything that is heavier than 10 lb (4.5 kg) until your doctor approves.  For at least 2 weeks, or as long as told by your doctor: ? Do not douche. ? Do not use tampons. ? Do not have sex. General instructions   Take over-the-counter and prescription medicines only as told by your doctor. This is very important if you take blood thinning medicine.  Do not take baths, swim, or use a hot tub until your doctor approves. Take showers instead of baths.  Wear compression stockings as told by your doctor.  It is up to you to get the results of your procedure. Ask your doctor when your results will be ready.  Keep all follow-up visits as told by your doctor. This is important. Contact a doctor if:  You have very bad cramps that get worse or do not get better with medicine.  You have very bad pain in your belly (abdomen).  You cannot drink fluids without throwing up (vomiting).  You get pain in a different part of the area between your belly and thighs (pelvis).  You have bad-smelling discharge from your vagina.  You have a rash. Get help right away if:  You are bleeding a lot from your vagina. A lot of bleeding means soaking more than one sanitary pad in an hour, for 2 hours in a row.  You have clumps of blood (blood clots) coming from your  vagina.  You have a fever or chills.  Your belly feels very tender or hard.  You have chest pain.  You have trouble breathing.  You cough up blood.  You feel dizzy.  You feel light-headed.  You pass out (faint).  You have pain in your neck or shoulder area. Summary  Take short walks often, followed by rest periods. Ask your doctor what activities are safe for you. After one or two days, you may be able to return to your normal activities.  Do not lift anything that is heavier than 10 lb (4.5 kg) until your doctor approves.  Do not take baths, swim, or use a hot tub until your doctor approves. Take showers instead of baths.  Contact your doctor if you have any symptoms of infection, like bad-smelling discharge from your vagina. This information is not intended to replace advice given to you by your health care provider. Make sure you discuss any questions you have with your health care provider. Document Revised: 12/14/2016 Document Reviewed: 09/19/2015 Elsevier Patient Education  2020 [employer].      Post Anesthesia Home Care Instructions  Activity: Get plenty of rest for the remainder of the day. A responsible individual must stay with you for 24 hours following the procedure.  For the next 24 hours, DO NOT: -Drive a car -Advertising copywriter -Drink alcoholic beverages -Take any medication unless instructed by your physician -Make any legal decisions or  sign important papers.  Meals: Start with liquid foods such as gelatin or soup. Progress to regular foods as tolerated. Avoid greasy, spicy, heavy foods. If nausea and/or vomiting occur, drink only clear liquids until the nausea and/or vomiting subsides. Call your physician if vomiting continues.  Special Instructions/Symptoms: Your throat may feel dry or sore from the anesthesia or the breathing tube placed in your throat during surgery. If this causes discomfort, gargle with warm salt water. The discomfort should  disappear within 24 hours.    Post Anesthesia Home Care Instructions  Activity: Get plenty of rest for the remainder of the day. A responsible individual must stay with you for 24 hours following the procedure.  For the next 24 hours, DO NOT: -Drive a car -Advertising copywriter -Drink alcoholic beverages -Take any medication unless instructed by your physician -Make any legal decisions or sign important papers.  Meals: Start with liquid foods such as gelatin or soup. Progress to regular foods as tolerated. Avoid greasy, spicy, heavy foods. If nausea and/or vomiting occur, drink only clear liquids until the nausea and/or vomiting subsides. Call your physician if vomiting continues.  Special Instructions/Symptoms: Your throat may feel dry or sore from the anesthesia or the breathing tube placed in your throat during surgery. If this causes discomfort, gargle with warm salt water. The discomfort should disappear within 24 hours.  If you had a scopolamine patch placed behind your ear for the management of post- operative nausea and/or vomiting:  1. The medication in the patch is effective for 72 hours, after which it should be removed.  Wrap patch in a tissue and discard in the trash. Wash hands thoroughly with soap and water. 2. You may remove the patch earlier than 72 hours if you experience unpleasant side effects which may include dry mouth, dizziness or visual disturbances. 3. Avoid touching the patch. Wash your hands with soap and water after contact with the patch.

## 2019-10-28 NOTE — Anesthesia Procedure Notes (Signed)
Procedure Name: LMA Insertion Date/Time: 10/28/2019 10:31 AM Performed by: Thornell Mule, CRNA Pre-anesthesia Checklist: Patient identified, Emergency Drugs available, Suction available and Patient being monitored Patient Re-evaluated:Patient Re-evaluated prior to induction Oxygen Delivery Method: Circle system utilized Preoxygenation: Pre-oxygenation with 100% oxygen Induction Type: IV induction Ventilation: Mask ventilation without difficulty LMA: LMA inserted LMA Size: 4.0 Number of attempts: 1 Placement Confirmation: positive ETCO2 Tube secured with: Tape Dental Injury: Teeth and Oropharynx as per pre-operative assessment

## 2019-10-28 NOTE — Anesthesia Preprocedure Evaluation (Addendum)
Anesthesia Evaluation  Patient identified by MRN, date of birth, ID band Patient awake    Reviewed: Allergy & Precautions, H&P , NPO status , Patient's Chart, lab work & pertinent test results, reviewed documented beta blocker date and time   Airway Mallampati: I  TM Distance: >3 FB Neck ROM: full    Dental no notable dental hx. (+) Teeth Intact, Dental Advisory Given   Pulmonary former smoker,    Pulmonary exam normal breath sounds clear to auscultation       Cardiovascular Exercise Tolerance: Good negative cardio ROS   Rhythm:regular Rate:Normal     Neuro/Psych PSYCHIATRIC DISORDERS Bipolar Disorder negative neurological ROS     GI/Hepatic negative GI ROS, Neg liver ROS,   Endo/Other  negative endocrine ROS  Renal/GU negative Renal ROS  negative genitourinary   Musculoskeletal   Abdominal   Peds  Hematology negative hematology ROS (+)   Anesthesia Other Findings   Reproductive/Obstetrics negative OB ROS                            Anesthesia Physical Anesthesia Plan  ASA: II  Anesthesia Plan: General   Post-op Pain Management:    Induction: Intravenous  PONV Risk Score and Plan: 3 and Ondansetron, Dexamethasone and Treatment may vary due to age or medical condition  Airway Management Planned: LMA and Oral ETT  Additional Equipment: None  Intra-op Plan:   Post-operative Plan:   Informed Consent: I have reviewed the patients History and Physical, chart, labs and discussed the procedure including the risks, benefits and alternatives for the proposed anesthesia with the patient or authorized representative who has indicated his/her understanding and acceptance.     Dental Advisory Given  Plan Discussed with: CRNA and Anesthesiologist  Anesthesia Plan Comments: ( )        Anesthesia Quick Evaluation

## 2019-10-29 ENCOUNTER — Encounter (HOSPITAL_BASED_OUTPATIENT_CLINIC_OR_DEPARTMENT_OTHER): Payer: Self-pay | Admitting: Obstetrics & Gynecology

## 2019-10-29 LAB — SURGICAL PATHOLOGY

## 2019-11-02 ENCOUNTER — Ambulatory Visit (INDEPENDENT_AMBULATORY_CARE_PROVIDER_SITE_OTHER): Payer: Federal, State, Local not specified - PPO | Admitting: Licensed Clinical Social Worker

## 2019-11-02 DIAGNOSIS — Z8659 Personal history of other mental and behavioral disorders: Secondary | ICD-10-CM | POA: Diagnosis not present

## 2019-11-03 NOTE — BH Specialist Note (Signed)
Integrated Behavioral Health via Telemedicine Video (Caregility) Visit  11/03/2019 Megan Vance [id number]  Number of Integrated Behavioral Health visits: 2 Session Start time: 3:15pm  Session End time: 3:33pm Total time: 18 minutes via phone (pt reported trouble logging into [place])  Referring Provider: Sharen Counter  Type of Service: Individual Patient/Family location: Home  [facility] Provider location: [facility]  All persons participating in visit: Pt Megan Vance and LCSWA A. Felton Clinton    I connected with Megan Vance and/or Megan Vance n/a by a video enabled telemedicine application (Caregility) and verified that I am speaking with the correct person using two identifiers.   Discussed confidentiality: yes  Confirmed demographics & insurance:  no  I discussed that engaging in this virtual visit, they consent to the provision of behavioral healthcare and the services will be billed under their insurance.   Patient and/or legal guardian expressed understanding and consented to virtual visit: yes  PRESENTING CONCERNS: Patient and/or family reports the following symptoms/concerns: History of depression and Bipolar Disorder  Duration of problem: approx four months ; Severity of problem: Mild   STRENGTHS (Protective Factors/Coping Skills): Supportive partner Megan Vance works full time   ASSESSMENT: Patient currently experiencing depression    GOALS ADDRESSED: Patient will: 1.  Reduce symptoms of: irritability,   2.  Increase knowledge and/or ability of: implement coping skills to alleviate symptoms   3.  Demonstrate ability to: self manage symptoms    Progress of Goals: Megan Vance reports improvement in mood. Megan Vance reports she is prioritizing sleep, and started writing.   INTERVENTIONS: Interventions utilized:  Supportive counseling  Standardized Assessments completed & reviewed: n/a   PLAN: 1. Follow up with behavioral health clinician on : as needed   2. Behavioral recommendations: Continue prioritizing sleep, incorporate self care techniques, continue with mindfulness techniques  3. Referral(s): n/a  I discussed the assessment and treatment plan with the patient and/or parent/guardian. They were provided an opportunity to ask questions and all were answered. They agreed with the plan and demonstrated an understanding of the instructions.   They were advised to call back or seek an in-person evaluation as appropriate.  I discussed that the purpose of this visit is to provide behavioral health care while limiting exposure to the novel coronavirus.  Discussed there is a possibility of technology failure and discussed alternative modes of communication if that failure occurs.  Gwyndolyn Saxon

## 2019-11-11 ENCOUNTER — Other Ambulatory Visit: Payer: Self-pay

## 2019-11-11 ENCOUNTER — Other Ambulatory Visit: Payer: Federal, State, Local not specified - PPO

## 2019-11-11 DIAGNOSIS — O3680X0 Pregnancy with inconclusive fetal viability, not applicable or unspecified: Secondary | ICD-10-CM

## 2019-11-12 LAB — BETA HCG QUANT (REF LAB): hCG Quant: 810 m[IU]/mL

## 2019-11-18 ENCOUNTER — Other Ambulatory Visit: Payer: Federal, State, Local not specified - PPO

## 2019-11-19 ENCOUNTER — Other Ambulatory Visit: Payer: Federal, State, Local not specified - PPO

## 2019-11-19 ENCOUNTER — Other Ambulatory Visit: Payer: Self-pay

## 2019-11-19 DIAGNOSIS — O3680X0 Pregnancy with inconclusive fetal viability, not applicable or unspecified: Secondary | ICD-10-CM | POA: Diagnosis not present

## 2019-11-20 LAB — BETA HCG QUANT (REF LAB): hCG Quant: 200 m[IU]/mL

## 2019-11-26 ENCOUNTER — Encounter: Payer: Self-pay | Admitting: Obstetrics & Gynecology

## 2019-11-26 ENCOUNTER — Other Ambulatory Visit: Payer: Self-pay

## 2019-11-26 ENCOUNTER — Encounter: Payer: Self-pay | Admitting: Obstetrics

## 2019-11-26 ENCOUNTER — Ambulatory Visit (INDEPENDENT_AMBULATORY_CARE_PROVIDER_SITE_OTHER): Payer: Federal, State, Local not specified - PPO | Admitting: Obstetrics & Gynecology

## 2019-11-26 VITALS — BP 110/71 | HR 78 | Ht 62.0 in | Wt 142.5 lb

## 2019-11-26 DIAGNOSIS — O020 Blighted ovum and nonhydatidiform mole: Secondary | ICD-10-CM | POA: Insufficient documentation

## 2019-11-26 NOTE — Progress Notes (Signed)
Subjective:     Megan Vance is a 24 y.o. female who presents to the clinic 4 weeks status post suction D&C  for molar pregnancy. Eating a regular diet without difficulty. Bowel movements are normal. The patient is not having any pain.  The following portions of the patient's history were reviewed and updated as appropriate: allergies, current medications, past family history, past medical history, past social history, past surgical history and problem list.  Review of Systems Pertinent items are noted in HPI.    Objective:    BP 110/71   Pulse 78   Ht 5\' 2"  (1.575 m)   Wt 142 lb 8 oz (64.6 kg)   LMP 08/07/2019   Breastfeeding Unknown   BMI 26.06 kg/m  General:  alert, cooperative and no distress  Abdomen: soft, non-tender        Assessment:    Doing well postoperatively. Operative findings again reviewed. Pathology report discussed.    Plan:    1. Continue any current medications. 2. Wound care discussed. 3. Activity restrictions: none 4. Anticipated return to work: now. 5. Follow up: 2 week beta HCG, repeated today, was 200 last visit  08/09/2019, MD 11/26/2019

## 2019-11-26 NOTE — Patient Instructions (Signed)
Molar Pregnancy  A molar pregnancy (hydatidiform mole) is a mass of tissue that grows in the uterus after an egg is fertilized incorrectly. The mass does not develop into a fetus, and is considered an abnormal pregnancy. Usually, the pregnancy ends on its own through miscarriage. In some cases, treatment may be required. What are the causes? This condition is caused by an egg that is fertilized incorrectly so that it has abnormal genetic material (chromosomes). This can result in one of two types of molar pregnancies:  Complete molar pregnancy. This is when all of the chromosomes in the fertilized egg are from the father, and none are from the mother.  Partial molar pregnancy. This is when the fertilized egg has chromosomes from the father and mother, but it has too many chromosomes. What increases the risk? This condition is more likely to develop in:  Women who are over the age of 57 or under the age of 42.  Women who have had a molar pregnancy in the past (very rare). Other possible risk factors include:  Smoking more than 15 cigarettes a day.  History of infertility.  Having a blood type A, B, or AB.  Having a lack (deficiency) of vitamin A.  Using birth control pills (oral contraceptives). What are the signs or symptoms? Symptoms of this condition include:  Vaginal bleeding.  Missed menstrual period.  The uterus growing faster than expected for a normal pregnancy.  Severe nausea and vomiting.  Severe pressure or pain in the uterus.  Abnormal ovarian cysts (theca lutein cysts).  Vaginal discharge that looks like grapes.  High blood pressure (early onset of preeclampsia).  Overactive thyroid gland (hyperthyroidism).  Not having enough red blood cells or hemoglobin (anemia). How is this diagnosed? This condition is diagnosed based on ultrasound and blood tests. How is this treated? Usually, molar pregnancies end on their own by miscarriage. A health care provider  may manage this condition by:  Monitoring the levels of pregnancy hormones in your blood to make sure that the hormone levels are decreasing as expected.  Giving you a medicine called Rho (D) immune globulin. This medicine helps to prevent problems that may occur in future pregnancies as a result of a protein on red blood cells (Rh factor). You may be given this medicine if you do not have an Rh factor (you are Rh negative) and your sex partner has an Rh factor (he is Rh positive).  Putting you on chemotherapy. This involves taking medicines that regulate levels of pregnancy hormones. This may be done if your pregnancy hormone levels are not decreasing as expected.  Performing a procedure called dilation and curettage (D&C), or vacuum curettage. These are minor procedures that involve scraping or suctioning the molar pregnancy out of the uterus and removing it through the vagina. Even if a molar pregnancy ends on its own, one of these procedures may be done to make sure that all the abnormal tissue is out of the uterus.  Doing a surgical removal of the uterus (hysterectomy). Follow these instructions at home:  Avoid getting pregnant for 6-12 months, or as long as told by your health care provider. To avoid getting pregnant, avoid having sex or use a reliable form of birth control every time you have sex.  Take over-the-counter and prescription medicines only as told by your health care provider.  Rest as needed, and slowly return to your normal activities.  Think about joining a support group. If you are struggling with grief, ask  your health care provider for help.  Keep all follow-up visits as told by your health care provider. This is important. You may need follow-up blood tests or ultrasounds. Contact a health care provider if:  You continue to have irregular vaginal bleeding.  You have abdominal pain. Summary  A molar pregnancy (hydatidiform mole) is a mass of tissue that grows in  the uterus after an egg is fertilized incorrectly.  This condition is more likely to develop in women who are over the age of 76 or under the age of 45 or women who have had a molar pregnancy in the past.  The most common symptom of this condition is vaginal bleeding.  Usually, molar pregnancy ends with a miscarriage, and no treatment is needed. This information is not intended to replace advice given to you by your health care provider. Make sure you discuss any questions you have with your health care provider. Document Revised: 12/14/2016 Document Reviewed: 03/07/2016 Elsevier Patient Education  2020 [employer].

## 2019-11-27 LAB — BETA HCG QUANT (REF LAB): hCG Quant: 135 m[IU]/mL

## 2019-11-27 NOTE — Progress Notes (Signed)
Please have HCG repeated in 2 weeks

## 2019-12-09 ENCOUNTER — Other Ambulatory Visit: Payer: Federal, State, Local not specified - PPO

## 2019-12-18 ENCOUNTER — Other Ambulatory Visit: Payer: Federal, State, Local not specified - PPO

## 2019-12-18 ENCOUNTER — Other Ambulatory Visit: Payer: Self-pay

## 2019-12-18 DIAGNOSIS — O020 Blighted ovum and nonhydatidiform mole: Secondary | ICD-10-CM | POA: Diagnosis not present

## 2019-12-19 LAB — BETA HCG QUANT (REF LAB): hCG Quant: 9 m[IU]/mL

## 2019-12-22 ENCOUNTER — Telehealth: Payer: Self-pay | Admitting: *Deleted

## 2019-12-22 NOTE — Telephone Encounter (Signed)
Patient called stating no one followed up with her regarding most recent beta hcg result. Beta hcg was completed on 12/18/2019: result 9. Patient had D&C on 10/28/19 for molar pregnancy, performed by Dr. Debroah Loop. Patient followed up with Dr. Debroah Loop on 11/26/19, beta hcg: 135. Consulted with Dr. Gerri Spore, patient need repeat beta hcg and follow up with Dr. Debroah Loop.   Patient requested Friday appointment. Friday 12/25/19 offered, but declined reporting will be out of town for boyfriend's birthday. Patient has upcoming appointment on 01/01/20 at 8:55 AM for annual/PAP. Patient would like beta hcg completed at that visit. Will forward to chart to [facility] to be aware.   Clovis Pu, RN

## 2019-12-28 NOTE — Progress Notes (Signed)
Repeat HCG around 12/17

## 2019-12-30 ENCOUNTER — Telehealth: Payer: Self-pay | Admitting: Lactation Services

## 2019-12-30 NOTE — Telephone Encounter (Signed)
-----   Message from Adam Phenix, MD sent at 12/28/2019  1:43 PM EST ----- Repeat HCG around 12/17

## 2019-12-30 NOTE — Telephone Encounter (Signed)
Called patient with results of Hcg. She has an appointment on Friday for follow up Hcg and appt with Dr. Debroah Loop. Patient has no questions or concerns.

## 2020-01-01 ENCOUNTER — Ambulatory Visit (INDEPENDENT_AMBULATORY_CARE_PROVIDER_SITE_OTHER): Payer: Federal, State, Local not specified - PPO | Admitting: Obstetrics and Gynecology

## 2020-01-01 ENCOUNTER — Encounter: Payer: Self-pay | Admitting: Obstetrics and Gynecology

## 2020-01-01 ENCOUNTER — Other Ambulatory Visit (HOSPITAL_COMMUNITY)
Admission: RE | Admit: 2020-01-01 | Discharge: 2020-01-01 | Disposition: A | Discharge status: Home / Self Care | Payer: Federal, State, Local not specified - PPO | Source: Ambulatory Visit | Attending: Obstetrics and Gynecology | Admitting: Obstetrics and Gynecology

## 2020-01-01 ENCOUNTER — Other Ambulatory Visit: Payer: Self-pay

## 2020-01-01 VITALS — BP 100/65 | HR 66 | Wt 135.0 lb

## 2020-01-01 DIAGNOSIS — Z01419 Encounter for gynecological examination (general) (routine) without abnormal findings: Secondary | ICD-10-CM

## 2020-01-01 DIAGNOSIS — Z124 Encounter for screening for malignant neoplasm of cervix: Secondary | ICD-10-CM

## 2020-01-01 DIAGNOSIS — Z113 Encounter for screening for infections with a predominantly sexual mode of transmission: Secondary | ICD-10-CM

## 2020-01-01 NOTE — Progress Notes (Signed)
Patient presents for Pap and Lab work for [facility]  today per notes.   *Pt wants to discuss mammogram. Advised typically done at 40.

## 2020-01-01 NOTE — Progress Notes (Signed)
GYNECOLOGY ANNUAL PREVENTATIVE CARE ENCOUNTER NOTE  History:     Megan Vance is a 24 y.o. G2P0 female here for a routine annual gynecologic exam.  Current complaints: None. Would like full STI panel today.    Denies abnormal vaginal bleeding, discharge, pelvic pain, problems with intercourse or other gynecologic concerns.      Recent molar pregnancy- last quant was 9 Gynecologic History Patient's last menstrual period was 08/07/2019. Contraception: none Last Pap: unknown.    Obstetric History OB History  Gravida Para Term Preterm AB Living  2            SAB IAB Ectopic Multiple Live Births               # Outcome Date GA Lbr Len/2nd Weight Sex Delivery Anes PTL Lv  2 Gravida           1 Megan ([place])             Past Medical History:  Diagnosis Date  . Bipolar affective disorder (HCC)   . History of fainting    since age 34   . Molar pregnancy     Past Surgical History:  Procedure Laterality Date  . DILATION AND EVACUATION N/A 10/28/2019   Procedure: DILATATION AND EVACUATION;  Surgeon: Megan Phenix, MD;  Location: [facility];  Service: Gynecology;  Laterality: N/A;  Confirmed with Megan Vance on 10/26/19 that this patient is on the Vance schedule.  CS  . OPERATIVE ULTRASOUND N/A 10/28/2019   Procedure: OPERATIVE ULTRASOUND;  Surgeon: Megan Phenix, MD;  Location: [facility];  Service: Gynecology;  Laterality: N/A;    Current Outpatient Medications on File Prior to Visit  Medication Sig Dispense Refill  . ferrous sulfate 325 (65 FE) MG tablet Take 1 tablet (325 mg total) by mouth daily with breakfast. (Patient not taking: No sig reported) 30 tablet 3  . oxyCODONE-acetaminophen (PERCOCET/ROXICET) 5-325 MG tablet Take 1-2 tablets by mouth every 6 (six) hours as needed. (Patient not taking: No sig reported) 12 tablet 0   No current facility-administered medications on file prior to visit.    Allergies  Allergen Reactions  . Tomato  Anaphylaxis  . Pineapple Other (See Comments)    Acid reflux    Social History:  reports that she has quit smoking. She has never used smokeless tobacco. She reports previous alcohol use. She reports previous drug use.  Family History  Problem Relation Age of Onset  . Kidney failure Mother   . Heart attack Mother   . Heart failure Maternal Grandmother     The following portions of the patient's history were reviewed and updated as appropriate: allergies, current medications, past family history, past medical history, past social history, past surgical history and problem list.  Review of Systems Pertinent items noted in HPI and remainder of comprehensive ROS otherwise negative.  Physical Exam:  BP 100/65   Pulse 66   Wt 135 lb (61.2 kg)   LMP 08/07/2019   BMI 24.69 kg/m  CONSTITUTIONAL: Well-developed, well-nourished female in no acute distress.  HENT:  Normocephalic, atraumatic, External right and left ear normal.  EYES: Conjunctivae and EOM are normal. Pupils are equal, round, and reactive to light. No scleral icterus.  NECK: Normal range of motion, supple, no masses.  Normal thyroid.  SKIN: Skin is warm and dry. No rash noted. Not diaphoretic. No erythema. No pallor. MUSCULOSKELETAL: Normal range of motion. No tenderness.  No cyanosis, clubbing,  or edema.   NEUROLOGIC: Alert and oriented to person, place, and time. Normal reflexes, muscle tone coordination.  PSYCHIATRIC: Normal mood and affect. Normal behavior. Normal judgment and thought content. CARDIOVASCULAR: Normal heart rate noted, regular rhythm RESPIRATORY: Clear to auscultation bilaterally. Effort and breath sounds normal, no problems with respiration noted. BREASTS: Symmetric in size. No masses, tenderness, skin changes, nipple drainage, or lymphadenopathy bilaterally. Performed in the presence of a chaperone. ABDOMEN: Soft, no distention noted.  No tenderness, rebound or guarding.  PELVIC: Normal appearing external  genitalia and urethral meatus; normal appearing vaginal mucosa and cervix.  No abnormal discharge noted.  Pap smear obtained.  Normal uterine size, no other palpable masses, no uterine or adnexal tenderness.  Performed in the presence of a chaperone.   Assessment and Plan:    1. Screening for cervical cancer  - Cytology - PAP( [place]) - Beta hCG quant (ref lab) - Hepatitis C Antibody - HIV antibody (with reflex)  2. Women's annual routine gynecological examination  - Beta hCG quant (ref lab)- Recent Molar pregnancy  - Hepatitis C Antibody - HIV antibody (with reflex)  Will follow up results of pap smear and manage accordingly. Routine preventative health maintenance measures emphasized. Please refer to After Visit Summary for other counseling recommendations.       Anglia Blakley, Harolyn Rutherford, NP  [facility] [employer], [facility]

## 2020-01-02 LAB — BETA HCG QUANT (REF LAB): hCG Quant: 3 m[IU]/mL

## 2020-01-02 LAB — HIV ANTIBODY (ROUTINE TESTING W REFLEX): HIV Screen 4th Generation wRfx: NONREACTIVE

## 2020-01-02 LAB — HEPATITIS C ANTIBODY: Hep C Virus Ab: <0.1 s/co ratio (ref 0.0–0.9)

## 2020-01-04 ENCOUNTER — Other Ambulatory Visit: Payer: Self-pay

## 2020-01-04 ENCOUNTER — Other Ambulatory Visit (HOSPITAL_COMMUNITY)
Admission: RE | Admit: 2020-01-04 | Discharge: 2020-01-04 | Disposition: A | Discharge status: Home / Self Care | Payer: Federal, State, Local not specified - PPO | Source: Ambulatory Visit | Attending: Obstetrics and Gynecology | Admitting: Obstetrics and Gynecology

## 2020-01-04 DIAGNOSIS — Z113 Encounter for screening for infections with a predominantly sexual mode of transmission: Secondary | ICD-10-CM | POA: Insufficient documentation

## 2020-01-04 NOTE — Addendum Note (Signed)
Addended by: Kennon Portela on: 01/04/2020 09:05 AM   Modules accepted: Orders

## 2020-01-05 LAB — CERVICOVAGINAL ANCILLARY ONLY
Bacterial Vaginitis (gardnerella): POSITIVE — AB
Candida Glabrata: NEGATIVE
Candida Vaginitis: NEGATIVE
Chlamydia: NEGATIVE
Comment: NEGATIVE
Comment: NEGATIVE
Comment: NEGATIVE
Comment: NEGATIVE
Comment: NEGATIVE
Comment: NORMAL
Neisseria Gonorrhea: NEGATIVE
Trichomonas: NEGATIVE

## 2020-01-05 LAB — CYTOLOGY - PAP
Diagnosis: NEGATIVE
Diagnosis: REACTIVE

## 2020-01-06 ENCOUNTER — Other Ambulatory Visit: Payer: Self-pay | Admitting: Obstetrics and Gynecology

## 2020-01-06 MED ORDER — METRONIDAZOLE 500 MG PO TABS: 500.0000 mg | ORAL_TABLET | Freq: Two times a day (BID) | ORAL | 0 refills | Status: AC

## 2020-09-07 ENCOUNTER — Encounter: Payer: Self-pay | Admitting: Obstetrics & Gynecology

## 2020-09-07 ENCOUNTER — Other Ambulatory Visit: Payer: Self-pay

## 2020-09-07 ENCOUNTER — Ambulatory Visit (INDEPENDENT_AMBULATORY_CARE_PROVIDER_SITE_OTHER): Payer: Federal, State, Local not specified - PPO | Admitting: Obstetrics & Gynecology

## 2020-09-07 VITALS — BP 111/64 | HR 78 | Ht 62.0 in | Wt 139.0 lb

## 2020-09-07 DIAGNOSIS — O020 Blighted ovum and nonhydatidiform mole: Secondary | ICD-10-CM | POA: Diagnosis not present

## 2020-09-07 DIAGNOSIS — L739 Follicular disorder, unspecified: Secondary | ICD-10-CM | POA: Diagnosis not present

## 2020-09-07 MED ORDER — SULFAMETHOXAZOLE-TRIMETHOPRIM 800-160 MG PO TABS: 1.0000 | ORAL_TABLET | Freq: Two times a day (BID) | ORAL | 1 refills | Status: DC

## 2020-09-07 NOTE — Progress Notes (Signed)
Pt presents today with vaginal irritation. States she thinks it is ingrown hairs. States she had a wax done a few weeks ago and noticed an influx of ingrown hairs on the right side.

## 2020-09-07 NOTE — Progress Notes (Signed)
Patient ID: Megan Vance, female   DOB: July 24, 1995, 25 y.o.   MRN: [id number]  Chief Complaint  Patient presents with   Vaginal Irritation    HPI Megan Vance is a 25 y.o. female.  G1P0010 Patient's last menstrual period was 08/17/2020 (approximate). For 2 weeks she has had bumps on the right vulva after waxing. She is using hair bump cream and they are improving HPI  Past Medical History:  Diagnosis Date   Bipolar affective disorder (HCC)    History of fainting    since age 52    Molar pregnancy     Past Surgical History:  Procedure Laterality Date   DILATION AND EVACUATION N/A 10/28/2019   Procedure: DILATATION AND EVACUATION;  Surgeon: Adam Phenix, MD;  Location: [facility];  Service: Gynecology;  Laterality: N/A;  Confirmed with [name] in [place] on 10/26/19 that this patient is on the [place] schedule.  CS   OPERATIVE ULTRASOUND N/A 10/28/2019   Procedure: OPERATIVE ULTRASOUND;  Surgeon: Adam Phenix, MD;  Location: [facility];  Service: Gynecology;  Laterality: N/A;    Family History  Problem Relation Age of Onset   Kidney failure Mother    Heart attack Mother    Heart failure Maternal Grandmother     Social History Social History   Tobacco Use   Smoking status: Former   Smokeless tobacco: Never  Building services engineer Use: Never used  Substance Use Topics   Alcohol use: Not Currently    Comment: socially   Drug use: Not Currently    Allergies  Allergen Reactions   Tomato Anaphylaxis   Pineapple Other (See Comments)    Acid reflux    Current Outpatient Medications  Medication Sig Dispense Refill   sulfamethoxazole-trimethoprim (BACTRIM DS) 800-160 MG tablet Take 1 tablet by mouth 2 (two) times daily. 14 tablet 1   No current facility-administered medications for this visit.    Review of Systems Review of Systems  Constitutional: Negative.  Negative for fever.  Respiratory: Negative.    Genitourinary:  Negative for  menstrual problem, pelvic pain, vaginal discharge and vaginal pain.   Blood pressure 111/64, pulse 78, height 5\' 2"  (1.575 m), weight 139 lb (63 kg), last menstrual period 08/17/2020, unknown if currently breastfeeding.  Physical Exam Physical Exam Vitals and nursing note reviewed. Exam conducted with a chaperone present.  Constitutional:      Appearance: Normal appearance.  Pulmonary:     Effort: Pulmonary effort is normal.  Genitourinary:    Exam position: Lithotomy position.       Comments: Small follicle cysts no redness or tenderness Skin:    General: Skin is warm and dry.  Neurological:     General: No focal deficit present.     Mental Status: She is alert and oriented to person, place, and time.    Data Reviewed HCG results  Assessment Folliculitis of perineum - Plan: sulfamethoxazole-trimethoprim (BACTRIM DS) 800-160 MG tablet  Complete molar pregnancy  Last HCG <5 Plan Meds ordered this encounter  Medications   sulfamethoxazole-trimethoprim (BACTRIM DS) 800-160 MG tablet    Sig: Take 1 tablet by mouth 2 (two) times daily.    Dispense:  14 tablet    Refill:  1       10/17/2020 09/07/2020, 4:14 PM

## 2021-02-16 IMAGING — US US OB < 14 WEEKS - US OB TV
1 series · 15 of 28 positions shown · non-contrast
Comparison: None.

CLINICAL DATA: Vaginal spotting with markedly high beta HCG

EXAM:
OBSTETRIC <14 WK US AND TRANSVAGINAL OB US
TECHNIQUE: Both transabdominal and transvaginal ultrasound examinations were
performed for complete evaluation of the gestation as well as the
maternal uterus, adnexal regions, and pelvic cul-de-sac.
Transvaginal technique was performed to assess early pregnancy.

[Series 1: us ob < 14 weeks - us ob tv · 15 of 126 slices shown]
[im 1/126]
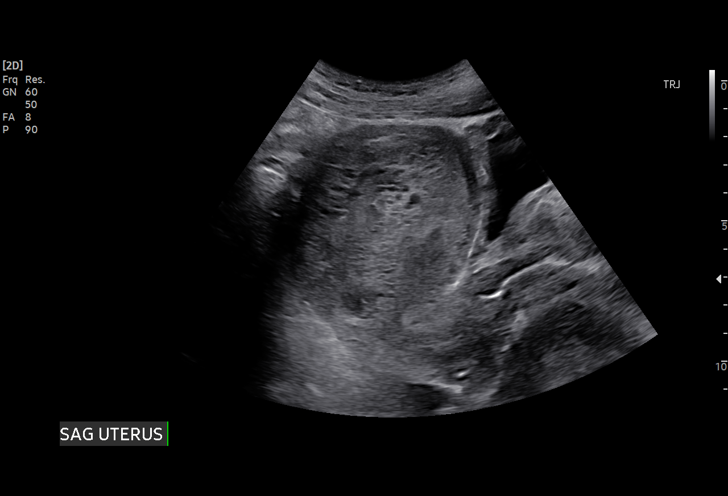
[im 10/126]
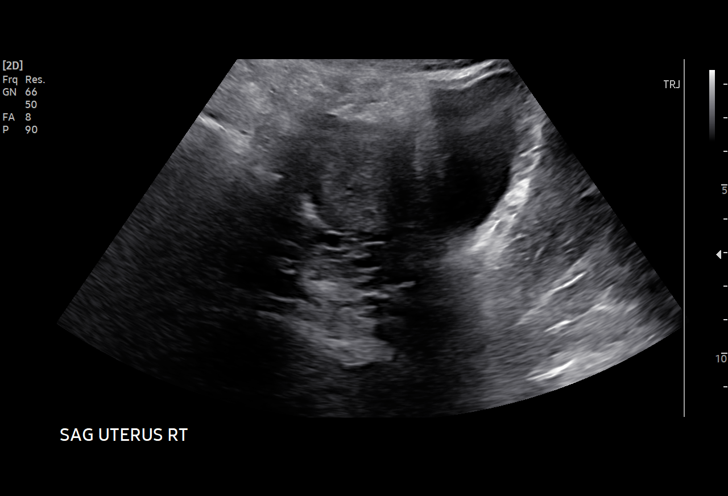
[im 19/126]
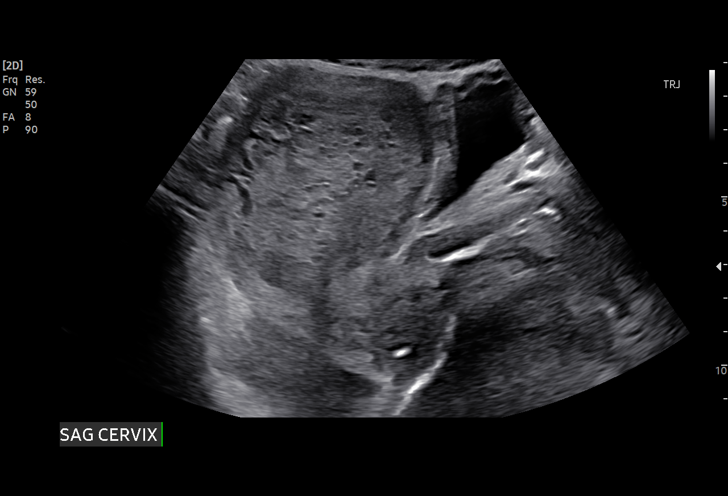
[im 28/126]
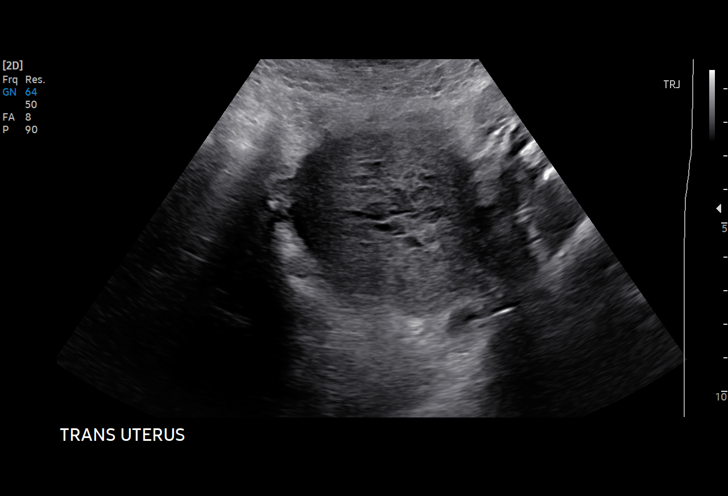
[im 38/126]
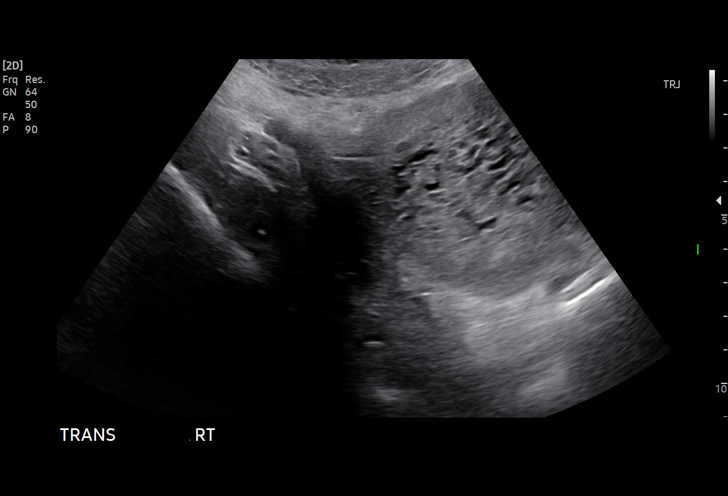
[im 47/126]
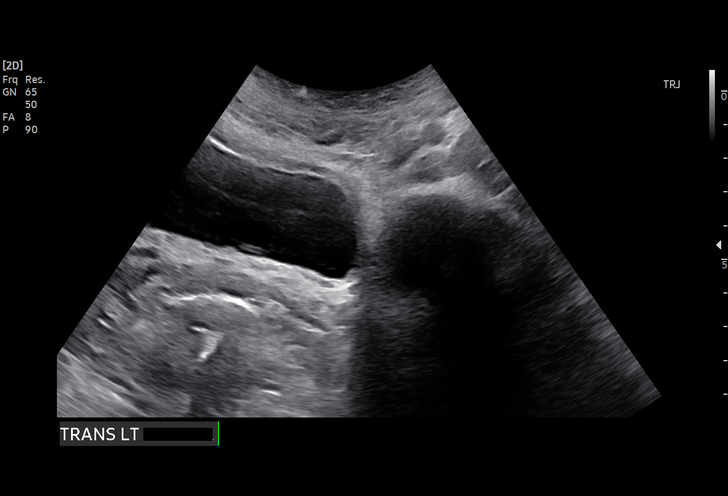
[im 56/126]
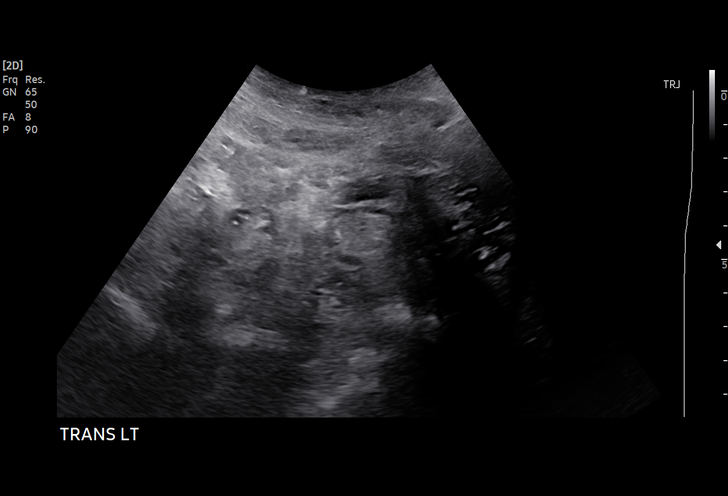
[im 65/126]
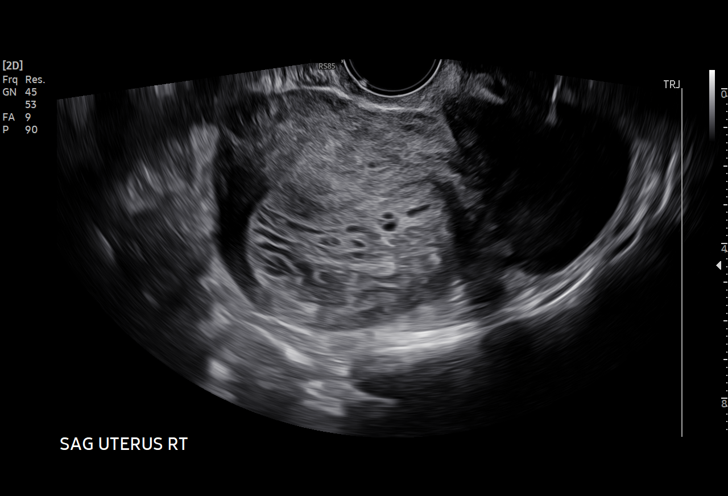
[im 70/126]
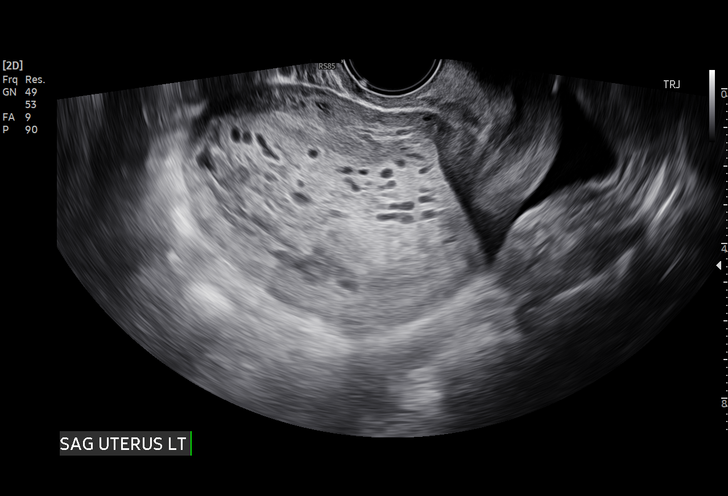
[im 79/126]
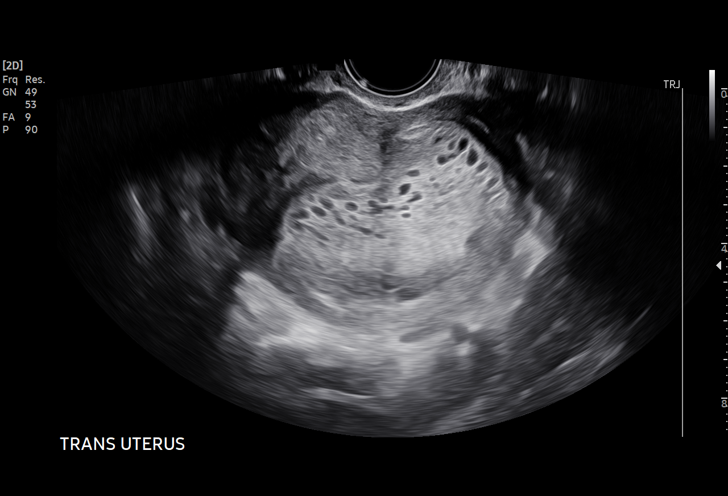
[im 88/126]
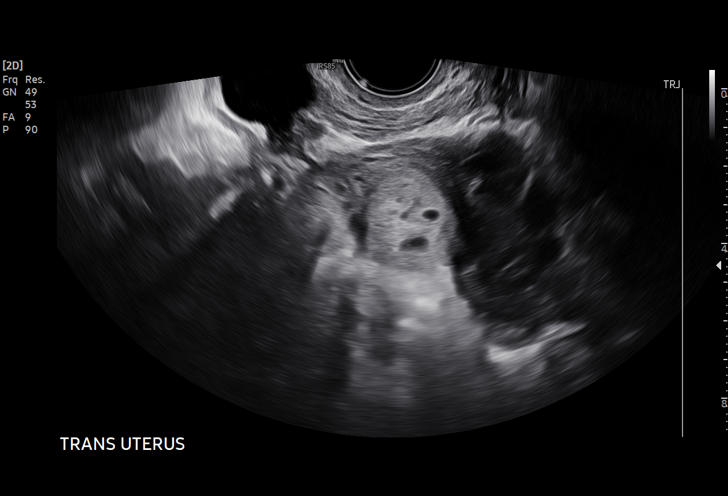
[im 98/126]
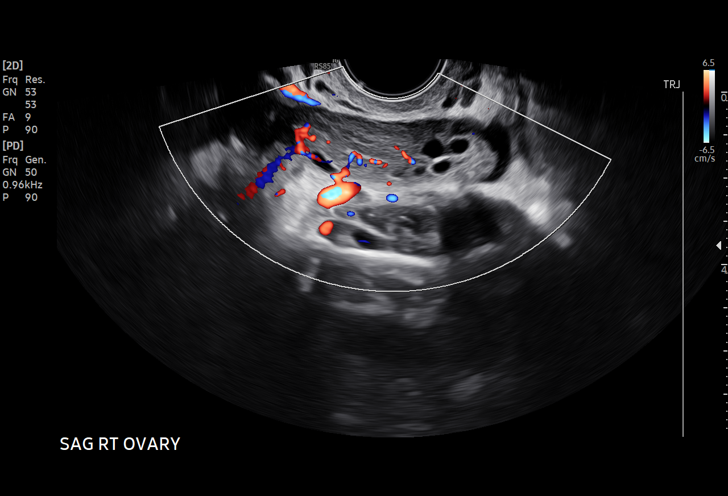
[im 107/126]
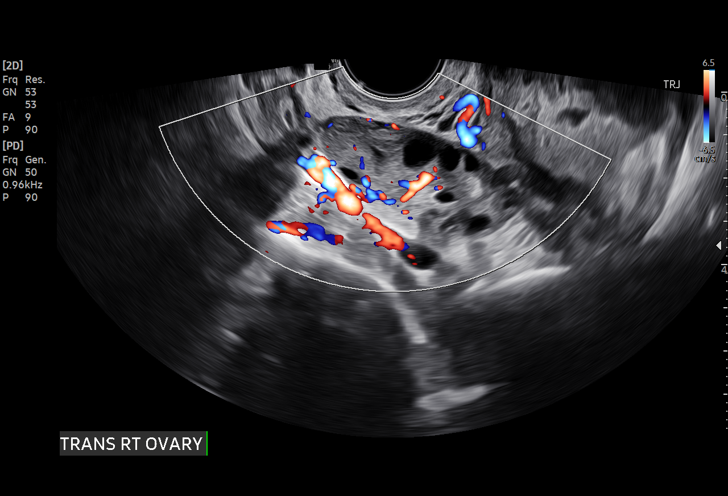
[im 116/126]
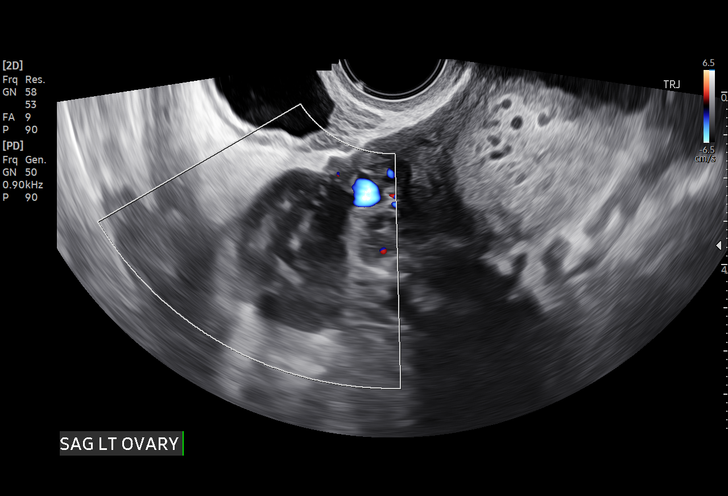
[im 126/126]
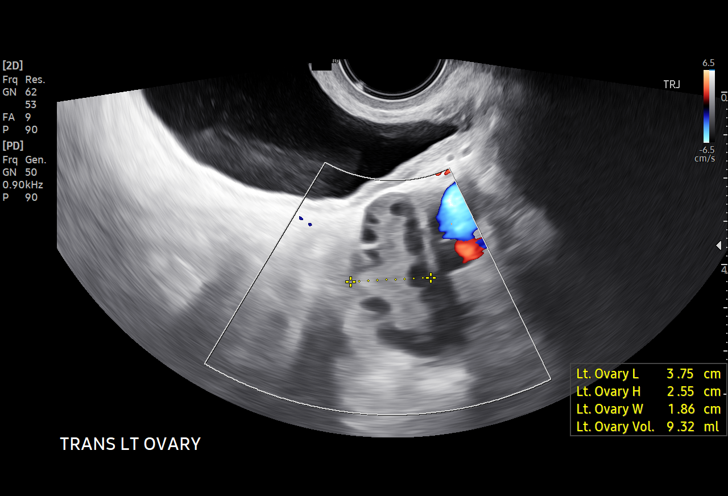

[15 of 28 positions shown; findings below may reference images not displayed]

FINDINGS: Intrauterine gestational sac: Not visualized

Yolk sac:  Not visualized

Embryo:  Not visualized

Cardiac Activity: Not visualized

Subchorionic hemorrhage:  None visualized.

Maternal uterus/adnexae: The endometrium is filled with
inhomogeneous material with multiple small cystic areas throughout
the endometrium. The endometrium measures 7.4 x 4.2 x 7.4 cm. There
is increased vascularity along the periphery of this complex
material expanding in filling the endometrium.

Right ovary measures 5.8 x 2.3 x 4.2 cm. Left ovary measures 3.8 x
2.6 x 1.9 cm. There are follicles in each ovary with a dominant
follicle in the right ovary measuring 2.0 x 1.4 x 1.4 cm. Small
amount of free pelvic fluid noted.
IMPRESSION: No intrauterine gestation. Marked thickening of the endometrium with
complex material including multiple small cysts throughout the
endometrium. This appearance raises concern for molar
pregnancy/gestational trophoblastic disease. Appropriate clinical
assessment in this regard advised.

No extrauterine pelvic or adnexal mass beyond dominant follicle
right ovary. Small amount of free fluid in pelvis which could
indicate recent ovarian cyst leakage or rupture.

These results will be called to the ordering clinician or
representative by the Radiologist Assistant, and communication
documented in the PACS or [REDACTED].

## 2021-04-12 ENCOUNTER — Ambulatory Visit (INDEPENDENT_AMBULATORY_CARE_PROVIDER_SITE_OTHER): Payer: Federal, State, Local not specified - PPO

## 2021-04-12 DIAGNOSIS — Z3202 Encounter for pregnancy test, result negative: Secondary | ICD-10-CM

## 2021-04-12 DIAGNOSIS — Z3200 Encounter for pregnancy test, result unknown: Secondary | ICD-10-CM

## 2021-04-12 LAB — POCT URINE PREGNANCY: Preg Test, Ur: NEGATIVE

## 2021-04-12 NOTE — Progress Notes (Signed)
Pt is in the office for UPT, states that she had positive test at home. ?Advised pt that in office UPTs were negative today x2 ?Pt states that she has not missed a cycle yet, LMP 03-13-21. ?Advised pt to wait to return in 1-2 weeks if cycle does not start and UPT can be repeated, pt agreed. ?

## 2021-04-13 NOTE — Progress Notes (Signed)
Patient was assessed and managed by nursing staff during this encounter. I have reviewed the chart and agree with the documentation and plan. I have also made any necessary editorial changes. ? ?Aune Adami A Keighan Amezcua, MD ?04/13/2021 8:21 AM   ?

## 2022-01-15 NOTE — L&D Delivery Note (Signed)
Operative Delivery Note  Cat 2 tracing with moderate variability and recurrent late decels with pushing.  Discussed plan for operative delivery due to non-reassuring fetal well being.  Verbal consent: obtained from patient.  Risks and benefits discussed in detail.  Risks include, but are not limited to the risks of anesthesia, bleeding, infection, damage to maternal tissues, fetal cephalhematoma.  There is also the risk of inability to effect vaginal delivery of the head, or shoulder dystocia that cannot be resolved by established maneuvers, leading to the need for emergency cesarean section.  Straight cath performed with drainage of ~ 30cc of dark urine.  SVE: OP presentation, +3 station.  Bell applied, pt pushing well with contractions.  2 pop-offs noted during effort- mostly due to poor seal- lots of fetal hair.  A total of 4 pulls performed with good descent of fetal head noted with each effort.    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  Presentation: vertex; Position: Occiput Posterior; Station: +3.  APGAR: see baby chart ; weight  8#1oz.   Placenta status: to pathology Cord:  with the following complications: .  Cord pH: collected  Anesthesia:  epidural Instruments: Bell Episiotomy: None Lacerations: 2nd degree Suture Repair: 2.0 3.0 vicryl Est. Blood Loss (mL): 354  Mom to postpartum.  NICU at beside- status pending whether NICU or couplet care.  Sharon Seller 08/17/2022, 5:07 PM

## 2022-02-09 ENCOUNTER — Ambulatory Visit (INDEPENDENT_AMBULATORY_CARE_PROVIDER_SITE_OTHER): Payer: BC Managed Care – PPO

## 2022-02-09 DIAGNOSIS — Z3A13 13 weeks gestation of pregnancy: Secondary | ICD-10-CM

## 2022-02-09 DIAGNOSIS — Z3482 Encounter for supervision of other normal pregnancy, second trimester: Secondary | ICD-10-CM

## 2022-02-09 DIAGNOSIS — Z3480 Encounter for supervision of other normal pregnancy, unspecified trimester: Secondary | ICD-10-CM | POA: Insufficient documentation

## 2022-02-09 MED ORDER — BLOOD PRESSURE KIT DEVI: 1.0000 | 0 refills | Status: DC

## 2022-02-09 MED ORDER — GOJJI WEIGHT SCALE MISC: 1.0000 | 0 refills | Status: DC

## 2022-02-09 NOTE — Progress Notes (Signed)
New OB Intake  I connected with Megan Vance  on 02/09/22 at  9:15 AM EST by telephone and verified that I am speaking with the correct person using two identifiers. Nurse is located at [facility] and pt is located at Home.  I discussed the limitations, risks, security and privacy concerns of performing an evaluation and management service by telephone and the availability of in person appointments. I also discussed with the patient that there may be a patient responsible charge related to this service. The patient expressed understanding and agreed to proceed.  I explained I am completing New OB Intake today. We discussed EDD of 08/22/22 that is based on early trimester scan. Pt is G2/P0010. I reviewed her allergies, medications, Medical/Surgical/OB history, and appropriate screenings. I informed her of [facility] services. [facility] information placed in AVS. Based on history, this is a low risk pregnancy.  Patient Active Problem List   Diagnosis Date Noted   Complete molar pregnancy 11/26/2019   Bipolar disorder, current condition not specified as either manic or depressive 10/20/2019    Concerns addressed today  Delivery Plans Plans to deliver at [facility]. Patient given information for [facility] Healthy Baby website for more information about Women's and [place]. Patient is not interested in water birth. Offered upcoming OB visit with CNM to discuss further.  MyChart/Babyscripts MyChart access verified. I explained pt will have some visits in office and some virtually. Babyscripts instructions given and order placed. Patient verifies receipt of registration text/e-mail. Account successfully created and app downloaded.  Blood Pressure Cuff/Weight Scale Blood pressure cuff ordered for patient to pick-up from [employer]. Explained after first prenatal appt pt will check weekly and document in 33. Patient does not have weight scale; order sent to [place], patient may track weight  weekly in Babyscripts.  Anatomy US Explained first scheduled [place] will be around 19 weeks. Anatomy US TBD.   Labs Discussed Johnsie Cancel genetic screening with patient. Would like both Panorama and Horizon drawn at new OB visit. Routine prenatal labs needed.  COVID Vaccine Patient has had COVID vaccine.   Is patient a CenteringPregnancy candidate?  Declined Declined due to Group setting Not a candidate due to  If accepted,    Social Determinants of Health Food Insecurity: Patient denies food insecurity. WIC Referral: Patient is interested in referral to [facility].  Transportation: Patient denies transportation needs. Childcare: Discussed no children allowed at ultrasound appointments. Offered childcare services; patient declines childcare services at this time.  First visit review I reviewed new OB appt with patient. I explained they will have a provider visit that includes pap smear, prenatal labs, and discuss plan of care for pregnancy. Explained pt will be seen by Lynnda Shields at first visit; encounter routed to appropriate provider. Explained that patient will be seen by pregnancy navigator following visit with provider.   Lucianne Lei, RN 02/09/2022  9:24 AM

## 2022-02-13 ENCOUNTER — Other Ambulatory Visit (HOSPITAL_COMMUNITY)
Admission: RE | Admit: 2022-02-13 | Discharge: 2022-02-13 | Disposition: A | Discharge status: Home / Self Care | Payer: BC Managed Care – PPO | Source: Ambulatory Visit | Attending: Obstetrics and Gynecology | Admitting: Obstetrics and Gynecology

## 2022-02-13 ENCOUNTER — Encounter: Payer: Self-pay | Admitting: Obstetrics and Gynecology

## 2022-02-13 ENCOUNTER — Ambulatory Visit (INDEPENDENT_AMBULATORY_CARE_PROVIDER_SITE_OTHER): Payer: Medicaid Other | Admitting: Obstetrics and Gynecology

## 2022-02-13 VITALS — BP 109/67 | HR 80 | Wt 151.0 lb

## 2022-02-13 DIAGNOSIS — O09A1 Supervision of pregnancy with history of molar pregnancy, first trimester: Secondary | ICD-10-CM

## 2022-02-13 DIAGNOSIS — Z3480 Encounter for supervision of other normal pregnancy, unspecified trimester: Secondary | ICD-10-CM | POA: Insufficient documentation

## 2022-02-13 DIAGNOSIS — Z3A12 12 weeks gestation of pregnancy: Secondary | ICD-10-CM | POA: Diagnosis not present

## 2022-02-13 DIAGNOSIS — O09A0 Supervision of pregnancy with history of molar pregnancy, unspecified trimester: Secondary | ICD-10-CM | POA: Insufficient documentation

## 2022-02-13 NOTE — Progress Notes (Signed)
INITIAL PRENATAL VISIT NOTE  Subjective:  Megan Vance is a 27 y.o. G3P0010 at [redacted]w[redacted]d by early ultrasound being seen today for her initial prenatal visit.  She has an obstetric history significant for molar pregnancy. She has an uncomplicated medical history.  Patient reports  mild headache .  Contractions: Not present. Vag. Bleeding: None.   . Denies leaking of fluid.    Past Medical History:  Diagnosis Date   Bipolar affective disorder [facility])    History of fainting    since age 71    Molar pregnancy     Past Surgical History:  Procedure Laterality Date   DILATION AND EVACUATION N/A 10/28/2019   Procedure: DILATATION AND EVACUATION;  Surgeon: Woodroe Mode, MD;  Location: [place];  Service: Gynecology;  Laterality: N/A;  Confirmed with [name] in [place] on 10/26/19 that this patient is on the [place] schedule.  CS   OPERATIVE ULTRASOUND N/A 10/28/2019   Procedure: OPERATIVE ULTRASOUND;  Surgeon: Woodroe Mode, MD;  Location: [place];  Service: Gynecology;  Laterality: N/A;    OB History  Gravida Para Term Preterm AB Living  3 0 0 0 1 0  SAB IAB Ectopic Multiple Live Births  0 0 0 0 0    # Outcome Date GA Lbr Len/2nd Weight Sex Delivery Anes PTL Lv  3 Current           2 Molar 2021          1 Gravida             Social History   Socioeconomic History   Marital status: Single    Spouse name: Not on file   Number of children: Not on file   Years of education: Not on file   Highest education level: Not on file  Occupational History   Not on file  Tobacco Use   Smoking status: Former    Types: Cigarettes    Quit date: 2014    Years since quitting: 10.0   Smokeless tobacco: Never  Vaping Use   Vaping Use: Never used  Substance and Sexual Activity   Alcohol use: Not Currently    Comment: socially, prior to pregnancy   Drug use: Not Currently    Types: Marijuana    Comment: not since confirmed pregnancy   Sexual activity: Yes     Partners: Male    Birth control/protection: None  Other Topics Concern   Not on file  Social History Narrative   Not on file   Social Determinants of Health   Financial Resource Strain: Low Risk  (02/09/2022)   Overall Financial Resource Strain (CARDIA)    Difficulty of Paying Living Expenses: Not hard at all  Food Insecurity: No Food Insecurity (02/09/2022)   Hunger Vital Sign    Worried About Running Out of Food in the Last Year: Never true    Ran Out of Food in the Last Year: Never true  Transportation Needs: No Transportation Needs (02/09/2022)   PRAPARE - Hydrologist (Medical): No    Lack of Transportation (Non-Medical): No  Physical Activity: Sufficiently Active (02/09/2022)   Exercise Vital Sign    Days of Exercise per Week: 5 days    Minutes of Exercise per Session: 30 min  Recent Concern: Physical Activity - Inactive (02/09/2022)   Exercise Vital Sign    Days of Exercise per Week: 0 days    Minutes of Exercise per Session:  0 min  Stress: Stress Concern Present (02/09/2022)   [place]    Feeling of Stress : To some extent  Social Connections: Socially Isolated (02/09/2022)   Social Connection and Isolation Panel [NHANES]    Frequency of Communication with Friends and Family: Once a week    Frequency of Social Gatherings with Friends and Family: Once a week    Attends Religious Services: Never    Marine scientist or Organizations: Yes    Attends Archivist Meetings: Never    Marital Status: Never married    Family History  Problem Relation Age of Onset   Kidney failure Mother    Heart attack Mother    Heart failure Maternal Grandmother      Current Outpatient Medications:    Blood Pressure Monitoring (BLOOD PRESSURE KIT) DEVI, 1 kit by Does not apply route once a week., Disp: 1 each, Rfl: 0   Misc. Devices (GOJJI WEIGHT SCALE) MISC, 1 Device by Does not  apply route every 30 (thirty) days., Disp: 1 each, Rfl: 0   Prenatal MV & Min w/FA-DHA (PRENATAL GUMMIES PO), Take 2 tablets by mouth daily., Disp: , Rfl:   Allergies  Allergen Reactions   Tomato Anaphylaxis   Pineapple Other (See Comments)    Acid reflux    Review of Systems: Negative except for what is mentioned in HPI.  Objective:   Vitals:   02/13/22 1108  BP: 109/67  Pulse: 80  Weight: 151 lb (68.5 kg)    Fetal Status: Fetal Heart Rate (bpm): 159         Physical Exam: BP 109/67   Pulse 80   Wt 151 lb (68.5 kg)   LMP 11/15/2021   BMI 27.62 kg/m  CONSTITUTIONAL: Well-developed, well-nourished female in no acute distress.  NEUROLOGIC: Alert and oriented to person, place, and time. Normal reflexes, muscle tone coordination. No cranial nerve deficit noted. PSYCHIATRIC: Normal mood and affect. Normal behavior. Normal judgment and thought content. SKIN: Skin is warm and dry. No rash noted. Not diaphoretic. No erythema. No pallor. HENT:  Normocephalic, atraumatic, External right and left ear normal. Oropharynx is clear and moist EYES: Conjunctivae and EOM are normal.  NECK: Normal range of motion, supple, no masses CARDIOVASCULAR: Normal heart rate noted, regular rhythm RESPIRATORY: Effort and breath sounds normal, no problems with respiration noted BREASTS: deferred ABDOMEN: Soft, nontender, nondistended, gravid. GU: normal appearing external female genitalia, nulliparous normal appearing cervix, scant white discharge in vagina, no lesions noted Bimanual: 13 weeks sized uterus, no adnexal tenderness or palpable lesions noted MUSCULOSKELETAL: Normal range of motion. EXT:  No edema and no tenderness. 2+ distal pulses.   Assessment and Plan:  Pregnancy: G3P0010 at [redacted]w[redacted]d by ultrasound  1. Supervision of other normal pregnancy, antepartum Continue routine prenatal care  - Cervicovaginal ancillary only( [place]) - [place] MFM OB COMP + 14 WK; Future - Ambulatory  referral to [facility] - CBC/D/Plt+RPR+Rh+ABO+RubIgG... - Culture, OB Urine - HgB A1c - [facility] - PANORAMA PRENATAL TEST FULL PANEL  2. [redacted] weeks gestation of pregnancy  - CBC/D/Plt+RPR+Rh+ABO+RubIgG... - Culture, OB Urine - HgB A1c - [facility] - PANORAMA PRENATAL TEST FULL PANEL  3. Hx of molar pregnancy, antepartum  - CBC/D/Plt+RPR+Rh+ABO+RubIgG... - Culture, OB Urine - HgB A1c - [facility] - PANORAMA PRENATAL TEST FULL PANEL   Preterm labor symptoms and general obstetric precautions including but not limited to vaginal bleeding, contractions, leaking of  fluid and fetal movement were reviewed in detail with the patient.  Please refer to After Visit Summary for other counseling recommendations.   Return in about 4 weeks (around 03/13/2022) for ROB, in person.  Griffin Basil 02/13/2022 1:16 PM

## 2022-02-14 LAB — CBC/D/PLT+RPR+RH+ABO+RUBIGG...
Antibody Screen: NEGATIVE
Basophils Absolute: 0 10*3/uL (ref 0.0–0.2)
Basos: 0 %
EOS (ABSOLUTE): 0.2 10*3/uL (ref 0.0–0.4)
Eos: 2 %
HCV Ab: NONREACTIVE
HIV Screen 4th Generation wRfx: NONREACTIVE
Hematocrit: 39 % (ref 34.0–46.6)
Hemoglobin: 13.1 g/dL (ref 11.1–15.9)
Hepatitis B Surface Ag: NEGATIVE
Immature Grans (Abs): 0 10*3/uL (ref 0.0–0.1)
Immature Granulocytes: 0 %
Lymphocytes Absolute: 2 10*3/uL (ref 0.7–3.1)
Lymphs: 21 %
MCH: 31.3 pg (ref 26.6–33.0)
MCHC: 33.6 g/dL (ref 31.5–35.7)
MCV: 93 fL (ref 79–97)
Monocytes Absolute: 0.6 10*3/uL (ref 0.1–0.9)
Monocytes: 6 %
Neutrophils Absolute: 6.6 10*3/uL (ref 1.4–7.0)
Neutrophils: 71 %
Platelets: 249 10*3/uL (ref 150–450)
RBC: 4.18 x10E6/uL (ref 3.77–5.28)
RDW: 13.1 % (ref 11.7–15.4)
RPR Ser Ql: NONREACTIVE
Rh Factor: POSITIVE
Rubella Antibodies, IGG: 4.43 index (ref >0.99)
WBC: 9.4 10*3/uL (ref 3.4–10.8)

## 2022-02-14 LAB — HEMOGLOBIN A1C
Est. average glucose Bld gHb Est-mCnc: 111 mg/dL
Hgb A1c MFr Bld: 5.5 % (ref 4.8–5.6)

## 2022-02-14 LAB — CERVICOVAGINAL ANCILLARY ONLY
Chlamydia: NEGATIVE
Comment: NEGATIVE
Comment: NEGATIVE
Comment: NORMAL
Neisseria Gonorrhea: NEGATIVE
Trichomonas: NEGATIVE

## 2022-02-14 LAB — HCV INTERPRETATION

## 2022-02-15 LAB — CULTURE, OB URINE

## 2022-02-15 LAB — URINE CULTURE, OB REFLEX

## 2022-02-19 LAB — PANORAMA PRENATAL TEST FULL PANEL:PANORAMA TEST PLUS 5 ADDITIONAL MICRODELETIONS: FETAL FRACTION: 15.1

## 2022-02-21 LAB — HORIZON CUSTOM: REPORT SUMMARY: NEGATIVE

## 2022-03-13 ENCOUNTER — Institutional Professional Consult (permissible substitution): Payer: Self-pay | Admitting: Licensed Clinical Social Worker

## 2022-03-13 ENCOUNTER — Ambulatory Visit (INDEPENDENT_AMBULATORY_CARE_PROVIDER_SITE_OTHER): Payer: BC Managed Care – PPO | Admitting: Obstetrics & Gynecology

## 2022-03-13 VITALS — BP 114/72 | HR 80 | Wt 161.0 lb

## 2022-03-13 DIAGNOSIS — Z3A16 16 weeks gestation of pregnancy: Secondary | ICD-10-CM

## 2022-03-13 DIAGNOSIS — Z3482 Encounter for supervision of other normal pregnancy, second trimester: Secondary | ICD-10-CM

## 2022-03-13 DIAGNOSIS — Z3480 Encounter for supervision of other normal pregnancy, unspecified trimester: Secondary | ICD-10-CM

## 2022-03-13 NOTE — Progress Notes (Signed)
Pt is interested and would like to know if she may able to have a waterbirth.

## 2022-03-13 NOTE — Progress Notes (Signed)
   PRENATAL VISIT NOTE  Subjective:  Megan Vance is a 27 y.o. G3P0010 at 75w6dbeing seen today for ongoing prenatal care.  She is currently monitored for the following issues for this low-risk pregnancy and has Bipolar disorder, current condition not specified as either manic or depressive; Supervision of other normal pregnancy, antepartum; and Hx of molar pregnancy, antepartum on their problem list.  Patient reports no complaints.  Contractions: Not present. Vag. Bleeding: None.   . Denies leaking of fluid.   The following portions of the patient's history were reviewed and updated as appropriate: allergies, current medications, past family history, past medical history, past social history, past surgical history and problem list.   Objective:   Vitals:   03/13/22 1105 03/13/22 1114  BP: 114/72 114/72  Pulse: 80 80  Weight: 73 kg 73 kg    Fetal Status: Fetal Heart Rate (bpm): 145         General:  Alert, oriented and cooperative. Patient is in no acute distress.  Skin: Skin is warm and dry. No rash noted.   Cardiovascular: Normal heart rate noted  Respiratory: Normal respiratory effort, no problems with respiration noted  Abdomen: Soft, gravid, appropriate for gestational age.  Pain/Pressure: Absent     Pelvic: Cervical exam deferred        Extremities: Normal range of motion.     Mental Status: Normal mood and affect. Normal behavior. Normal judgment and thought content.   Assessment and Plan:  Pregnancy: G3P0010 at 130w6d. Supervision of other normal pregnancy, antepartum ONTD screening - AFP, Serum, Open Spina Bifida  Preterm labor symptoms and general obstetric precautions including but not limited to vaginal bleeding, contractions, leaking of fluid and fetal movement were reviewed in detail with the patient. Please refer to After Visit Summary for other counseling recommendations.   Return in about 4 weeks (around 04/10/2022). Wants to discuss water birth Future  Appointments  Date Time Provider [place]3/13/2024  1:30 PM [facility]  JaEmeterio ReeveMD

## 2022-03-16 LAB — AFP, SERUM, OPEN SPINA BIFIDA
AFP MoM: 1.64
AFP Value: 65.6 ng/mL
Gest. Age on Collection Date: 16.9 weeks
Maternal Age At EDD: 27 yr
OSBR Risk 1 IN: 3791
Test Results:: NEGATIVE
Weight: 161 [lb_av]

## 2022-03-28 ENCOUNTER — Ambulatory Visit: Payer: BC Managed Care – PPO | Attending: Obstetrics and Gynecology

## 2022-03-28 DIAGNOSIS — Z3A19 19 weeks gestation of pregnancy: Secondary | ICD-10-CM | POA: Diagnosis not present

## 2022-03-28 DIAGNOSIS — Z363 Encounter for antenatal screening for malformations: Secondary | ICD-10-CM | POA: Insufficient documentation

## 2022-03-28 DIAGNOSIS — Z3480 Encounter for supervision of other normal pregnancy, unspecified trimester: Secondary | ICD-10-CM | POA: Diagnosis present

## 2022-04-09 NOTE — Progress Notes (Unsigned)
   PRENATAL VISIT NOTE  Subjective:  Megan Vance is a 27 y.o. G3P0010 at [redacted]w[redacted]d being seen today for ongoing prenatal care.  She is currently monitored for the following issues for this {Blank single:19197::"high-risk","low-risk"} pregnancy and has Bipolar disorder, current condition not specified as either manic or depressive; Supervision of other normal pregnancy, antepartum; and Hx of molar pregnancy, antepartum on their problem list.  Patient reports {sx:14538}.   .  .   . Denies leaking of fluid.   The following portions of the patient's history were reviewed and updated as appropriate: allergies, current medications, past family history, past medical history, past social history, past surgical history and problem list.   Objective:  There were no vitals filed for this visit.  Fetal Status:           General:  Alert, oriented and cooperative. Patient is in no acute distress.  Skin: Skin is warm and dry. No rash noted.   Cardiovascular: Normal heart rate noted  Respiratory: Normal respiratory effort, no problems with respiration noted  Abdomen: Soft, gravid, appropriate for gestational age.        Pelvic: {Blank single:19197::"Cervical exam performed in the presence of a chaperone","Cervical exam deferred"}        Extremities: Normal range of motion.     Mental Status: Normal mood and affect. Normal behavior. Normal judgment and thought content.   Assessment and Plan:  Pregnancy: G3P0010 at [redacted]w[redacted]d 1. Supervision of other normal pregnancy, antepartum ***  2. [redacted] weeks gestation of pregnancy ***  3. Bipolar disorder, current condition not specified as either manic or depressive ***  {Blank single:19197::"Term","Preterm"} labor symptoms and general obstetric precautions including but not limited to vaginal bleeding, contractions, leaking of fluid and fetal movement were reviewed in detail with the patient. Please refer to After Visit Summary for other counseling recommendations.    No follow-ups on file.  Future Appointments  Date Time Provider [place]  04/10/2022  9:35 AM Left[place]-Kirby, Kathie Dike, CNM [facility] None    Fatima Blank, CNM

## 2022-04-10 ENCOUNTER — Encounter: Payer: Self-pay | Admitting: Advanced Practice Midwife

## 2022-04-10 ENCOUNTER — Ambulatory Visit (INDEPENDENT_AMBULATORY_CARE_PROVIDER_SITE_OTHER): Payer: BC Managed Care – PPO | Admitting: Advanced Practice Midwife

## 2022-04-10 VITALS — BP 113/63 | HR 77 | Wt 171.0 lb

## 2022-04-10 DIAGNOSIS — Z3480 Encounter for supervision of other normal pregnancy, unspecified trimester: Secondary | ICD-10-CM

## 2022-04-10 DIAGNOSIS — F340 Cyclothymic disorder: Secondary | ICD-10-CM

## 2022-04-10 DIAGNOSIS — Z3482 Encounter for supervision of other normal pregnancy, second trimester: Secondary | ICD-10-CM

## 2022-04-10 DIAGNOSIS — Z3A20 20 weeks gestation of pregnancy: Secondary | ICD-10-CM

## 2022-04-10 NOTE — Progress Notes (Signed)
Pt presents for ROB visit. No concerns at this time.  

## 2022-04-24 ENCOUNTER — Encounter (HOSPITAL_BASED_OUTPATIENT_CLINIC_OR_DEPARTMENT_OTHER): Payer: Self-pay | Admitting: Emergency Medicine

## 2022-04-24 ENCOUNTER — Emergency Department (HOSPITAL_BASED_OUTPATIENT_CLINIC_OR_DEPARTMENT_OTHER)
Admission: EM | Admit: 2022-04-24 | Discharge: 2022-04-24 | Disposition: A | Discharge status: Home / Self Care | Payer: BC Managed Care – PPO | Attending: Emergency Medicine | Admitting: Emergency Medicine

## 2022-04-24 ENCOUNTER — Other Ambulatory Visit: Payer: Self-pay

## 2022-04-24 DIAGNOSIS — Y92410 Unspecified street and highway as the place of occurrence of the external cause: Secondary | ICD-10-CM | POA: Diagnosis not present

## 2022-04-24 DIAGNOSIS — Z3A24 24 weeks gestation of pregnancy: Secondary | ICD-10-CM | POA: Diagnosis not present

## 2022-04-24 DIAGNOSIS — O09892 Supervision of other high risk pregnancies, second trimester: Secondary | ICD-10-CM | POA: Diagnosis not present

## 2022-04-24 DIAGNOSIS — M25512 Pain in left shoulder: Secondary | ICD-10-CM

## 2022-04-24 DIAGNOSIS — O9A212 Injury, poisoning and certain other consequences of external causes complicating pregnancy, second trimester: Secondary | ICD-10-CM | POA: Diagnosis not present

## 2022-04-24 DIAGNOSIS — T1490XA Injury, unspecified, initial encounter: Secondary | ICD-10-CM | POA: Diagnosis not present
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
## 2022-04-24 NOTE — Discharge Instructions (Addendum)
We discussed treating your pain with Tylenol.  You may also apply warm compresses to your low back.  If you experience any abdominal cramping, vaginal bleeding, vaginal discharge you will need to return to the emergency department.
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
## 2022-05-02 ENCOUNTER — Ambulatory Visit (INDEPENDENT_AMBULATORY_CARE_PROVIDER_SITE_OTHER): Payer: BC Managed Care – PPO | Admitting: Family Medicine

## 2022-05-02 VITALS — BP 116/79 | HR 80 | Wt 174.8 lb

## 2022-05-02 DIAGNOSIS — Z3480 Encounter for supervision of other normal pregnancy, unspecified trimester: Secondary | ICD-10-CM

## 2022-05-02 DIAGNOSIS — Z3482 Encounter for supervision of other normal pregnancy, second trimester: Secondary | ICD-10-CM

## 2022-05-02 DIAGNOSIS — Z3A24 24 weeks gestation of pregnancy: Secondary | ICD-10-CM

## 2022-05-02 MED ORDER — CYCLOBENZAPRINE HCL 10 MG PO TABS
10.0000 mg | ORAL_TABLET | Freq: Three times a day (TID) | ORAL | 0 refills | Status: DC | PRN
Start: 2022-05-02 — End: 2022-07-27
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
## 2022-05-10 ENCOUNTER — Encounter: Payer: BC Managed Care – PPO | Admitting: Student

## 2022-06-07 ENCOUNTER — Ambulatory Visit (INDEPENDENT_AMBULATORY_CARE_PROVIDER_SITE_OTHER): Payer: BC Managed Care – PPO | Admitting: Obstetrics and Gynecology

## 2022-06-07 ENCOUNTER — Other Ambulatory Visit: Payer: Self-pay | Admitting: Family Medicine

## 2022-06-07 ENCOUNTER — Encounter: Payer: Self-pay | Admitting: Obstetrics and Gynecology

## 2022-06-07 ENCOUNTER — Other Ambulatory Visit: Payer: BC Managed Care – PPO

## 2022-06-07 VITALS — BP 124/82 | HR 72 | Wt 184.3 lb

## 2022-06-07 DIAGNOSIS — Z3480 Encounter for supervision of other normal pregnancy, unspecified trimester: Secondary | ICD-10-CM

## 2022-06-07 DIAGNOSIS — Z3483 Encounter for supervision of other normal pregnancy, third trimester: Secondary | ICD-10-CM | POA: Diagnosis not present

## 2022-06-07 DIAGNOSIS — Z3A28 28 weeks gestation of pregnancy: Secondary | ICD-10-CM | POA: Diagnosis not present
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
## 2022-06-08 LAB — CBC
Hematocrit: 32.3 % — ABNORMAL LOW (ref 34.0–46.6)
Hemoglobin: 10.2 g/dL — ABNORMAL LOW (ref 11.1–15.9)
MCH: 28.2 pg (ref 26.6–33.0)
MCHC: 31.6 g/dL (ref 31.5–35.7)
MCV: 89 fL (ref 79–97)
Platelets: 239 10*3/uL (ref 150–450)
RBC: 3.62 x10E6/uL — ABNORMAL LOW (ref 3.77–5.28)
RDW: 12.1 % (ref 11.7–15.4)
WBC: 6.8 10*3/uL (ref 3.4–10.8)

## 2022-06-08 LAB — HIV ANTIBODY (ROUTINE TESTING W REFLEX): HIV Screen 4th Generation wRfx: NONREACTIVE

## 2022-06-08 LAB — GLUCOSE TOLERANCE, 2 HOURS W/ 1HR
Glucose, 1 hour: 124 mg/dL (ref 70–179)
Glucose, 2 hour: 122 mg/dL (ref 70–152)
Glucose, Fasting: 88 mg/dL (ref 70–91)

## 2022-06-08 LAB — RPR: RPR Ser Ql: NONREACTIVE

## 2022-06-25 ENCOUNTER — Encounter: Payer: Self-pay | Admitting: Advanced Practice Midwife

## 2022-06-25 ENCOUNTER — Ambulatory Visit (INDEPENDENT_AMBULATORY_CARE_PROVIDER_SITE_OTHER): Payer: BC Managed Care – PPO | Admitting: Advanced Practice Midwife

## 2022-06-25 VITALS — BP 111/66 | HR 97 | Wt 193.0 lb

## 2022-06-25 DIAGNOSIS — R202 Paresthesia of skin: Secondary | ICD-10-CM

## 2022-06-25 DIAGNOSIS — G5600 Carpal tunnel syndrome, unspecified upper limb: Secondary | ICD-10-CM

## 2022-06-25 DIAGNOSIS — R200 Anesthesia of skin: Secondary | ICD-10-CM

## 2022-06-25 DIAGNOSIS — Z3480 Encounter for supervision of other normal pregnancy, unspecified trimester: Secondary | ICD-10-CM

## 2022-06-25 DIAGNOSIS — O99353 Diseases of the nervous system complicating pregnancy, third trimester: Secondary | ICD-10-CM

## 2022-06-25 DIAGNOSIS — Z3A31 31 weeks gestation of pregnancy: Secondary | ICD-10-CM

## 2022-06-25 NOTE — Progress Notes (Signed)
Pt presents for ROB visit. Pt c.o numbness in the hands and feet. No concerns.

## 2022-06-25 NOTE — Progress Notes (Signed)
   PRENATAL VISIT NOTE  Subjective:  Megan Vance is a 27 y.o. G3P0010 at [redacted]w[redacted]d being seen today for ongoing prenatal care.  She is currently monitored for the following issues for this low-risk pregnancy and has Bipolar disorder, current condition not specified as either manic or depressive (HCC); Supervision of other normal pregnancy, antepartum; and Hx of molar pregnancy, antepartum on their problem list.  Patient reports  tingling hands and feet .  Contractions: Not present. Vag. Bleeding: None.  Movement: Present. Denies leaking of fluid.   The following portions of the patient's history were reviewed and updated as appropriate: allergies, current medications, past family history, past medical history, past social history, past surgical history and problem list.   Objective:   Vitals:   06/25/22 0839  BP: 111/66  Pulse: 97  Weight: 193 lb (87.5 kg)    Fetal Status: Fetal Heart Rate (bpm): 139 Fundal Height: 33 cm Movement: Present     General:  Alert, oriented and cooperative. Patient is in no acute distress.  Skin: Skin is warm and dry. No rash noted.   Cardiovascular: Normal heart rate noted  Respiratory: Normal respiratory effort, no problems with respiration noted  Abdomen: Soft, gravid, appropriate for gestational age.  Pain/Pressure: Absent     Pelvic: Cervical exam deferred        Extremities: Normal range of motion.  Edema: None  Mental Status: Normal mood and affect. Normal behavior. Normal judgment and thought content.   Assessment and Plan:  Pregnancy: G3P0010 at [redacted]w[redacted]d 1. Supervision of other normal pregnancy, antepartum --Anticipatory guidance about next visits/weeks of pregnancy given.  - Pt interested in waterbirth and has attended the class.  - Reviewed conditions in labor that will risk her out of water immersion including thick meconium or blood stained amniotic fluid, non-reassuring fetal status on monitor, excessive bleeding, hypertension, dizziness, use of  IV meds, damaged equipment or staffing that does not allow for water immersion, etc.  - Discussed other labor support options if waterbirth becomes unavailable, including position change, freedom of movement, use of birthing ball, and/or use of hydrotherapy in the shower (dependent upon medical condition/provider discretion).    2. [redacted] weeks gestation of pregnancy   3. Pregnancy related carpal tunnel syndrome in third trimester --Pt managing well, occurs only when lying down --Ice better than heat, notify if worsening for Rx for wrist braces  4. Numbness and tingling of both feet --Likely nerve pinched due to pregnancy/inflammation.  Only occurs when lying down --Add more pillows, change positions, ice to low back or groin area --Notify office if worsening   Preterm labor symptoms and general obstetric precautions including but not limited to vaginal bleeding, contractions, leaking of fluid and fetal movement were reviewed in detail with the patient. Please refer to After Visit Summary for other counseling recommendations.   Return in about 2 weeks (around 07/09/2022) for Midwife preferred.  Future Appointments  Date Time Provider Department Center  07/10/2022 10:35 AM Carlynn Herald, CNM [facility] None    Sharen Counter, CNM

## 2022-06-27 ENCOUNTER — Encounter (HOSPITAL_COMMUNITY): Payer: Self-pay | Admitting: Obstetrics and Gynecology

## 2022-06-27 ENCOUNTER — Inpatient Hospital Stay (HOSPITAL_COMMUNITY)
Admission: AD | Admit: 2022-06-27 | Discharge: 2022-06-28 | Disposition: A | Discharge status: Home / Self Care | Payer: BC Managed Care – PPO | Attending: Obstetrics and Gynecology | Admitting: Obstetrics and Gynecology

## 2022-06-27 DIAGNOSIS — Z3A32 32 weeks gestation of pregnancy: Secondary | ICD-10-CM | POA: Diagnosis not present

## 2022-06-27 DIAGNOSIS — O09293 Supervision of pregnancy with other poor reproductive or obstetric history, third trimester: Secondary | ICD-10-CM | POA: Diagnosis not present

## 2022-06-27 DIAGNOSIS — M549 Dorsalgia, unspecified: Secondary | ICD-10-CM

## 2022-06-27 DIAGNOSIS — R519 Headache, unspecified: Secondary | ICD-10-CM | POA: Insufficient documentation

## 2022-06-27 DIAGNOSIS — O99343 Other mental disorders complicating pregnancy, third trimester: Secondary | ICD-10-CM | POA: Diagnosis not present

## 2022-06-27 DIAGNOSIS — O26893 Other specified pregnancy related conditions, third trimester: Secondary | ICD-10-CM | POA: Diagnosis not present

## 2022-06-27 DIAGNOSIS — O4703 False labor before 37 completed weeks of gestation, third trimester: Secondary | ICD-10-CM

## 2022-06-27 DIAGNOSIS — R109 Unspecified abdominal pain: Secondary | ICD-10-CM | POA: Diagnosis not present

## 2022-06-27 DIAGNOSIS — R110 Nausea: Secondary | ICD-10-CM | POA: Diagnosis not present

## 2022-06-27 DIAGNOSIS — R42 Dizziness and giddiness: Secondary | ICD-10-CM | POA: Insufficient documentation

## 2022-06-27 DIAGNOSIS — Z3689 Encounter for other specified antenatal screening: Secondary | ICD-10-CM

## 2022-06-27 LAB — CBC WITH DIFFERENTIAL/PLATELET
Abs Immature Granulocytes: 0.15 10*3/uL — ABNORMAL HIGH (ref 0.00–0.07)
Basophils Absolute: 0 10*3/uL (ref 0.0–0.1)
Basophils Relative: 0 %
Eosinophils Absolute: 0 10*3/uL (ref 0.0–0.5)
Eosinophils Relative: 0 %
HCT: 34 % — ABNORMAL LOW (ref 36.0–46.0)
Hemoglobin: 10.8 g/dL — ABNORMAL LOW (ref 12.0–15.0)
Immature Granulocytes: 2 %
Lymphocytes Relative: 5 %
Lymphs Abs: 0.4 10*3/uL — ABNORMAL LOW (ref 0.7–4.0)
MCH: 27.3 pg (ref 26.0–34.0)
MCHC: 31.8 g/dL (ref 30.0–36.0)
MCV: 86.1 fL (ref 80.0–100.0)
Monocytes Absolute: 0.5 10*3/uL (ref 0.1–1.0)
Monocytes Relative: 6 %
Neutro Abs: 7.4 10*3/uL (ref 1.7–7.7)
Neutrophils Relative %: 87 %
Platelets: 238 10*3/uL (ref 150–400)
RBC: 3.95 MIL/uL (ref 3.87–5.11)
RDW: 13 % (ref 11.5–15.5)
WBC: 8.5 10*3/uL (ref 4.0–10.5)
nRBC: 0 % (ref 0.0–0.2)

## 2022-06-27 LAB — BASIC METABOLIC PANEL
Anion gap: 13 (ref 5–15)
BUN: 6 mg/dL (ref 6–20)
CO2: 19 mmol/L — ABNORMAL LOW (ref 22–32)
Calcium: 9 mg/dL (ref 8.9–10.3)
Chloride: 100 mmol/L (ref 98–111)
Creatinine, Ser: 0.69 mg/dL (ref 0.44–1.00)
GFR, Estimated: >60 mL/min (ref >60)
Glucose, Bld: 86 mg/dL (ref 70–99)
Potassium: 3.2 mmol/L — ABNORMAL LOW (ref 3.5–5.1)
Sodium: 132 mmol/L — ABNORMAL LOW (ref 135–145)

## 2022-06-27 LAB — URINALYSIS, ROUTINE W REFLEX MICROSCOPIC
Bacteria, UA: NONE SEEN
Bilirubin Urine: NEGATIVE
Glucose, UA: NEGATIVE mg/dL
Hgb urine dipstick: NEGATIVE
Ketones, ur: 80 mg/dL — AB
Nitrite: NEGATIVE
Protein, ur: 100 mg/dL — AB
Specific Gravity, Urine: 1.024 (ref 1.005–1.030)
Squamous Epithelial / HPF: >50 /HPF (ref 0–5)
pH: 5 (ref 5.0–8.0)

## 2022-06-27 MED ORDER — DIPHENHYDRAMINE HCL 50 MG/ML IJ SOLN
25.0000 mg | Freq: Once | INTRAMUSCULAR | Status: AC
  Administered 2022-06-27: 25 mg via INTRAVENOUS
  Filled 2022-06-27: qty 1

## 2022-06-27 MED ORDER — METOCLOPRAMIDE HCL 10 MG PO TABS: 10.0000 mg | ORAL_TABLET | Freq: Once | ORAL | Status: DC

## 2022-06-27 MED ORDER — LACTATED RINGERS IV BOLUS
1000.0000 mL | Freq: Once | INTRAVENOUS | Status: AC
  Administered 2022-06-27: 1000 mL via INTRAVENOUS

## 2022-06-27 MED ORDER — METOCLOPRAMIDE HCL 5 MG/ML IJ SOLN
10.0000 mg | Freq: Once | INTRAMUSCULAR | Status: AC
  Administered 2022-06-27: 10 mg via INTRAVENOUS
  Filled 2022-06-27: qty 2

## 2022-06-27 NOTE — MAU Note (Addendum)
.  Shyonna Carlin is a 27 y.o. at [redacted]w[redacted]d here in MAU reporting: arrived via EMS with c/o since this am of upper and lower ABD sharp pain, back pain that feels like spasm intermittently, dizziness, nausea with one episode of emesis this am , and HA that has light sensitivity.  Pt took a Flexeril and Advil last night for the back pain, with no relief. Pt endorses FM, but due to pain has not notice how often. Pt denies VB, LOF, abnormal discharge, other PIH s/s, and complications in the pregnancy.    Onset of complaint: yesterday night  Pain score: 7/10 Vitals:   06/27/22 2205 06/27/22 2210  BP:  126/65  Pulse:  (!) 117  Resp:    Temp:    SpO2: 97% 98%     FHT:165 Lab orders placed from triage:  UA

## 2022-06-27 NOTE — MAU Provider Note (Signed)
History     CSN: [id number]  Arrival date and time: 06/27/22 2155   Event Date/Time   First Provider Initiated Contact with Patient 06/27/22 2227      Chief Complaint  Patient presents with   Back Pain   Abdominal Pain    Megan Vance is a 27 y.o. G2P0010 at [redacted]w[redacted]d who receives care at [facility].  She presents today for abdominal and back pain as well as dizziness, nausea, and headache with light sensitivity.  She reports her abdominal pain started "some hours ago," while the back pain started   She states she has been having back pain, headache, and dizziness since yesterday.  She states she has taken Advil and Flexeril with no relief of any symptoms.  She endorses fetal movement, but states it is decreased.  She denies vaginal concerns. She reports position change helps and water helps with her discomforts.  She reports that the pain is worsened with laying on one side for extended periods of time.   She states she has not eaten anything today d/t nausea.   OB History     Gravida  2   Para  0   Term  0   Preterm  0   AB  1   Living  0      SAB  0   IAB  0   Ectopic  0   Multiple  0   Live Births  0           Past Medical History:  Diagnosis Date   Bipolar affective disorder (HCC)    History of fainting    since age 8    Molar pregnancy     Past Surgical History:  Procedure Laterality Date   DILATION AND EVACUATION N/A 10/28/2019   Procedure: DILATATION AND EVACUATION;  Surgeon: Adam Phenix, MD;  Location: [facility];  Service: Gynecology;  Laterality: N/A;  Confirmed with [name] in [place] on 10/26/19 that this patient is on the [place] schedule.  CS   OPERATIVE ULTRASOUND N/A 10/28/2019   Procedure: OPERATIVE ULTRASOUND;  Surgeon: Adam Phenix, MD;  Location: [facility];  Service: Gynecology;  Laterality: N/A;    Family History  Problem Relation Age of Onset   Kidney failure Mother    Heart attack Mother    Heart  failure Maternal Grandmother     Social History   Tobacco Use   Smoking status: Former    Types: Cigarettes    Quit date: 2014    Years since quitting: 10.4   Smokeless tobacco: Never  Vaping Use   Vaping Use: Never used  Substance Use Topics   Alcohol use: Not Currently    Comment: socially, prior to pregnancy   Drug use: Not Currently    Types: Marijuana    Comment: not since confirmed pregnancy    Allergies:  Allergies  Allergen Reactions   Tomato Anaphylaxis   Pineapple Other (See Comments)    Acid reflux    Medications Prior to Admission  Medication Sig Dispense Refill Last Dose   cyclobenzaprine (FLEXERIL) 10 MG tablet Take 1 tablet (10 mg total) by mouth every 8 (eight) hours as needed for muscle spasms. 9 tablet 0 06/26/2022   ibuprofen (ADVIL) 200 MG tablet Take 200 mg by mouth every 6 (six) hours as needed for headache.   06/26/2022   Prenatal MV & Min w/FA-DHA (PRENATAL GUMMIES PO) Take 2 tablets by mouth daily.  Review of Systems  Constitutional:  Negative for chills and fever.  Eyes:  Negative for visual disturbance.  Gastrointestinal:  Positive for abdominal pain and nausea. Negative for constipation, diarrhea and vomiting.  Genitourinary:  Negative for difficulty urinating, dysuria, vaginal bleeding and vaginal discharge.  Musculoskeletal:  Positive for back pain.  Neurological:  Positive for dizziness and headaches (Frontal; Sharp). Negative for light-headedness.   Physical Exam   Blood pressure 126/65, pulse (!) 117, temperature 99.5 F (37.5 C), temperature source Oral, resp. rate 18, last menstrual period 11/15/2021, SpO2 98 %, unknown if currently breastfeeding.  Physical Exam Vitals reviewed. Exam conducted with a chaperone present.  Constitutional:      Appearance: She is well-developed.  HENT:     Head: Normocephalic and atraumatic.  Eyes:     Conjunctiva/sclera: Conjunctivae normal.  Cardiovascular:     Rate and Rhythm: Tachycardia  present.  Pulmonary:     Effort: Pulmonary effort is normal. No respiratory distress.  Abdominal:     Palpations: Abdomen is soft.     Tenderness: There is generalized abdominal tenderness.  Musculoskeletal:        General: Normal range of motion.     Cervical back: Normal range of motion.     Right lower leg: No edema.     Left lower leg: No edema.  Skin:    General: Skin is warm and dry.  Neurological:     Mental Status: She is alert and oriented to person, place, and time.  Psychiatric:        Mood and Affect: Mood normal.        Behavior: Behavior normal.     Fetal Assessment 165 bpm, Mod Var, -Decels, +Accels Toco: No ctx graphed  MAU Course   Results for orders placed or performed during the hospital encounter of 06/27/22 (from the past 24 hour(s))  Urinalysis, Routine w reflex microscopic -Urine, Clean Catch     Status: Abnormal   Collection Time: 06/27/22 10:13 PM  Result Value Ref Range   Color, Urine AMBER (A) YELLOW   APPearance CLOUDY (A) CLEAR   Specific Gravity, Urine 1.024 1.005 - 1.030   pH 5.0 5.0 - 8.0   Glucose, UA NEGATIVE NEGATIVE mg/dL   Hgb urine dipstick NEGATIVE NEGATIVE   Bilirubin Urine NEGATIVE NEGATIVE   Ketones, ur 80 (A) NEGATIVE mg/dL   Protein, ur 161 (A) NEGATIVE mg/dL   Nitrite NEGATIVE NEGATIVE   Leukocytes,Ua LARGE (A) NEGATIVE   RBC / HPF 6-10 0 - 5 RBC/hpf   WBC, UA 21-50 0 - 5 WBC/hpf   Bacteria, UA NONE SEEN NONE SEEN   Squamous Epithelial / HPF >50 0 - 5 /HPF   Mucus PRESENT   CBC with Differential/Platelet     Status: Abnormal   Collection Time: 06/27/22 11:03 PM  Result Value Ref Range   WBC 8.5 4.0 - 10.5 K/uL   RBC 3.95 3.87 - 5.11 MIL/uL   Hemoglobin 10.8 (L) 12.0 - 15.0 g/dL   HCT 09.6 (L) 04.5 - 40.9 %   MCV 86.1 80.0 - 100.0 fL   MCH 27.3 26.0 - 34.0 pg   MCHC 31.8 30.0 - 36.0 g/dL   RDW 81.1 91.4 - 78.2 %   Platelets 238 150 - 400 K/uL   nRBC 0.0 0.0 - 0.2 %   Neutrophils Relative % 87 %   Neutro Abs 7.4  1.7 - 7.7 K/uL   Lymphocytes Relative 5 %   Lymphs Abs 0.4 (L) 0.7 -  4.0 K/uL   Monocytes Relative 6 %   Monocytes Absolute 0.5 0.1 - 1.0 K/uL   Eosinophils Relative 0 %   Eosinophils Absolute 0.0 0.0 - 0.5 K/uL   Basophils Relative 0 %   Basophils Absolute 0.0 0.0 - 0.1 K/uL   Immature Granulocytes 2 %   Abs Immature Granulocytes 0.15 (H) 0.00 - 0.07 K/uL  Basic metabolic panel     Status: Abnormal   Collection Time: 06/27/22 11:03 PM  Result Value Ref Range   Sodium 132 (L) 135 - 145 mmol/L   Potassium 3.2 (L) 3.5 - 5.1 mmol/L   Chloride 100 98 - 111 mmol/L   CO2 19 (L) 22 - 32 mmol/L   Glucose, Bld 86 70 - 99 mg/dL   BUN 6 6 - 20 mg/dL   Creatinine, Ser 9.60 0.44 - 1.00 mg/dL   Calcium 9.0 8.9 - 45.4 mg/dL   GFR, Estimated >09 >81 mL/min   Anion gap 13 5 - 15   No results found.  MDM PE Labs: CBC/D,BMP, UA EFM Start IV LR Bolus x 3 HA Cocktail Assessment and Plan  27 year old G2P0010  SIUP at 32.1 weeks Cat I FT Abdominal Pain Back Pain Headache  Dizziness Nausea   -POC Reviewed -Exam performed and findings discussed. -Start IV and give fluids. -Labs ordered.  -Patient requests and given oral fluids. -Discussed reglan and benadryl for HA treatment. -Informed that will also treat nausea. -Will await until nausea improved to assess need for pain medication. -Patient agreeable.  -Monitor and reassess.    Cherre Robins MSN, CNM 06/27/2022, 10:27 PM   Reassessment (12:29 AM) -Patient reports HA now 3/10. -She reports nausea has resolved. -States back pain still 5/10. -Informed of noted ctx that could be contributing to back pain. -Also informed of UA with +ketones. -Will give additional IV fluids and start procardia protocol. -Will also apply heat to back. -Patient agreeable.  -NST reactive and FHR has improved. Now 155 bpm, Mod Var, -Decels, +Accels -Monitor and reassess.   Reassessment (2:18 AM) -Patient unable to receive procardia d/t blood  pressures. -However, contractions improved with 2nd bag. -Patient continues to report dizziness. -Will give one additional bag of fluid and reassess. -NST remains reactive. Okay to discontinue monitoring.   Reassessment (3:47 AM) -Patient reports continued back pain and dizziness. -Patient offered and declines pain medication.  -Cervical exam performed and closed/thick/middle. -Cultures collected as odor noted.  -Informed that results would be available on mychart. If abnormal provider to send script to pharmacy.  -Patient without questions.  -Encouraged to call primary office or return to MAU if symptoms worsen or with the onset of new symptoms. -Discharged to home in stable condition.  Cherre Robins MSN, CNM Advanced Practice Provider, Center for [employer]

## 2022-06-28 DIAGNOSIS — R109 Unspecified abdominal pain: Secondary | ICD-10-CM

## 2022-06-28 DIAGNOSIS — Z3A32 32 weeks gestation of pregnancy: Secondary | ICD-10-CM | POA: Diagnosis not present

## 2022-06-28 DIAGNOSIS — O26893 Other specified pregnancy related conditions, third trimester: Secondary | ICD-10-CM

## 2022-06-28 LAB — WET PREP, GENITAL
Sperm: NONE SEEN
Trich, Wet Prep: NONE SEEN
WBC, Wet Prep HPF POC: <10 (ref <10)
Yeast Wet Prep HPF POC: NONE SEEN

## 2022-06-28 LAB — GC/CHLAMYDIA PROBE AMP (~~LOC~~) NOT AT ARMC
Chlamydia: NEGATIVE
Comment: NEGATIVE
Comment: NORMAL
Neisseria Gonorrhea: NEGATIVE

## 2022-06-28 MED ORDER — NIFEDIPINE 10 MG PO CAPS
10.0000 mg | ORAL_CAPSULE | ORAL | Status: DC | PRN
  Filled 2022-06-28: qty 1

## 2022-06-28 MED ORDER — LACTATED RINGERS IV BOLUS
1000.0000 mL | Freq: Once | INTRAVENOUS | Status: AC
  Administered 2022-06-28: 1000 mL via INTRAVENOUS

## 2022-06-28 MED ORDER — LACTATED RINGERS IV BOLUS: 1000.0000 mL | Freq: Once | INTRAVENOUS | Status: DC

## 2022-07-10 ENCOUNTER — Ambulatory Visit (INDEPENDENT_AMBULATORY_CARE_PROVIDER_SITE_OTHER): Payer: BC Managed Care – PPO | Admitting: Certified Nurse Midwife

## 2022-07-10 ENCOUNTER — Encounter: Payer: Self-pay | Admitting: Certified Nurse Midwife

## 2022-07-10 VITALS — BP 117/75 | HR 88 | Wt 194.0 lb

## 2022-07-10 DIAGNOSIS — Z3A33 33 weeks gestation of pregnancy: Secondary | ICD-10-CM

## 2022-07-10 DIAGNOSIS — Z3493 Encounter for supervision of normal pregnancy, unspecified, third trimester: Secondary | ICD-10-CM

## 2022-07-10 DIAGNOSIS — Z3483 Encounter for supervision of other normal pregnancy, third trimester: Secondary | ICD-10-CM

## 2022-07-10 NOTE — Progress Notes (Signed)
Pt presents for ROB visit. Pt has completed water birth class. No concerns.

## 2022-07-10 NOTE — Progress Notes (Signed)
   PRENATAL VISIT NOTE  Subjective:  Megan Vance is a 27 y.o. G2P0010 at [redacted]w[redacted]d being seen today for ongoing prenatal care.  She is currently monitored for the following issues for this low-risk pregnancy and has Bipolar disorder, current condition not specified as either manic or depressive (HCC); Supervision of other normal pregnancy, antepartum; and Hx of molar pregnancy, antepartum on their problem list.  Patient reports no complaints.  Contractions: Not present. Vag. Bleeding: None.  Movement: Present. Denies leaking of fluid.   The following portions of the patient's history were reviewed and updated as appropriate: allergies, current medications, past family history, past medical history, past social history, past surgical history and problem list.   Objective:   Vitals:   07/10/22 1049  BP: 117/75  Pulse: 88  Weight: 194 lb (88 kg)    Fetal Status: Fetal Heart Rate (bpm): 134 Fundal Height: 32 cm Movement: Present     General:  Alert, oriented and cooperative. Patient is in no acute distress.  Skin: Skin is warm and dry. No rash noted.   Cardiovascular: Normal heart rate noted  Respiratory: Normal respiratory effort, no problems with respiration noted  Abdomen: Soft, gravid, appropriate for gestational age.  Pain/Pressure: Absent     Pelvic: Cervical exam deferred        Extremities: Normal range of motion.  Edema: Trace  Mental Status: Normal mood and affect. Normal behavior. Normal judgment and thought content.   Assessment and Plan:  Pregnancy: G2P0010 at [redacted]w[redacted]d 1. Encounter for supervision of low-risk pregnancy in third trimester - Patient overall doing well.  - She reports vigorous and frequent fetal movement   2. [redacted] weeks gestation of pregnancy - Fundal height appropriate for gestational age today. Continue to watch  -Pt interested in waterbirth and has attended the class.  - Reviewed conditions in labor that will risk her out of water immersion including thick  meconium or blood stained amniotic fluid, non-reassuring fetal status on monitor, excessive bleeding, hypertension, dizziness, use of IV meds, damaged equipment or staffing that does not allow for water immersion, etc.  - The attending midwife must be on the unit for water immersion to begin; pt understands this may delay the start of water immersion. - Reminded pt that signing consent in labor at the hospital also acknowledges they will exit the tub if the attending midwife requests. - Consent given to patient for review.  Consent will be reviewed and signed at the hospital by the waterbirth provider prior to use of the tub. - Discussed other labor support options if waterbirth becomes unavailable, including position change, freedom of movement, use of birthing ball, and/or use of hydrotherapy in the shower (dependent upon medical condition/provider discretion).    Preterm labor symptoms and general obstetric precautions including but not limited to vaginal bleeding, contractions, leaking of fluid and fetal movement were reviewed in detail with the patient. Please refer to After Visit Summary for other counseling recommendations.   Return in about 2 weeks (around 07/24/2022) for LOB.  Future Appointments  Date Time Provider Department Center  07/27/2022  9:55 AM Megan Vance, CNM [facility] None   Afnan Emberton (Danella Deis) Suzie Portela, MSN, CNM  Center for [facility]  07/10/2022 11:25 AM

## 2022-07-27 ENCOUNTER — Encounter: Payer: Self-pay | Admitting: Certified Nurse Midwife

## 2022-07-27 ENCOUNTER — Other Ambulatory Visit (HOSPITAL_COMMUNITY): Admission: RE | Admit: 2022-07-27 | Payer: BC Managed Care – PPO | Source: Ambulatory Visit

## 2022-07-27 ENCOUNTER — Ambulatory Visit: Payer: BC Managed Care – PPO | Admitting: Certified Nurse Midwife

## 2022-07-27 VITALS — BP 117/70 | HR 68 | Wt 201.6 lb

## 2022-07-27 DIAGNOSIS — Z3A36 36 weeks gestation of pregnancy: Secondary | ICD-10-CM | POA: Insufficient documentation

## 2022-07-27 DIAGNOSIS — Z3403 Encounter for supervision of normal first pregnancy, third trimester: Secondary | ICD-10-CM | POA: Diagnosis present

## 2022-07-27 DIAGNOSIS — Z3483 Encounter for supervision of other normal pregnancy, third trimester: Secondary | ICD-10-CM | POA: Diagnosis not present

## 2022-07-27 MED ORDER — CYCLOBENZAPRINE HCL 10 MG PO TABS
10.0000 mg | ORAL_TABLET | Freq: Three times a day (TID) | ORAL | 0 refills | Status: DC | PRN
Start: 2022-07-27 — End: 2022-08-19
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
## 2022-07-27 NOTE — Progress Notes (Signed)
Pt presents for ROB visit. Pt c/o intense back pain since last night. Pt requesting refill of Flexeril.

## 2022-07-30 ENCOUNTER — Encounter: Payer: Self-pay | Admitting: Certified Nurse Midwife

## 2022-07-30 LAB — CERVICOVAGINAL ANCILLARY ONLY
Bacterial Vaginitis (gardnerella): POSITIVE — AB
Candida Glabrata: NEGATIVE
Candida Vaginitis: NEGATIVE
Chlamydia: NEGATIVE
Comment: NEGATIVE
Comment: NEGATIVE
Comment: NEGATIVE
Comment: NEGATIVE
Comment: NEGATIVE
Comment: NORMAL
Neisseria Gonorrhea: NEGATIVE
Trichomonas: NEGATIVE

## 2022-07-31 ENCOUNTER — Other Ambulatory Visit: Payer: Self-pay

## 2022-07-31 DIAGNOSIS — N760 Acute vaginitis: Secondary | ICD-10-CM

## 2022-07-31 LAB — CULTURE, BETA STREP (GROUP B ONLY): Strep Gp B Culture: NEGATIVE

## 2022-07-31 MED ORDER — METRONIDAZOLE 500 MG PO TABS
500.0000 mg | ORAL_TABLET | Freq: Two times a day (BID) | ORAL | 0 refills | Status: AC
Start: 2022-07-31 — End: 2022-08-07

## 2022-08-03 ENCOUNTER — Ambulatory Visit (INDEPENDENT_AMBULATORY_CARE_PROVIDER_SITE_OTHER): Payer: BC Managed Care – PPO | Admitting: Obstetrics and Gynecology

## 2022-08-03 ENCOUNTER — Encounter: Payer: Self-pay | Admitting: Obstetrics and Gynecology

## 2022-08-03 VITALS — BP 114/74 | HR 93 | Wt 207.0 lb

## 2022-08-03 DIAGNOSIS — Z3A37 37 weeks gestation of pregnancy: Secondary | ICD-10-CM

## 2022-08-03 DIAGNOSIS — Z3480 Encounter for supervision of other normal pregnancy, unspecified trimester: Secondary | ICD-10-CM

## 2022-08-03 NOTE — Progress Notes (Signed)
   LOW-RISK PREGNANCY OFFICE VISIT Patient name: Megan Vance MRN [id number]  Date of birth: 08/16/1995 Chief Complaint:   Routine Prenatal Visit  History of Present Illness:   Megan Vance is a 27 y.o. G2P0010 female at [redacted]w[redacted]d with an Estimated Date of Delivery: 08/22/22 being seen today for ongoing management of a low-risk pregnancy.  Today she reports no complaints. Contractions: Not present. Vag. Bleeding: None.  Movement: Present. denies leaking of fluid. Review of Systems:   Pertinent items are noted in HPI Denies abnormal vaginal discharge w/ itching/odor/irritation, headaches, visual changes, shortness of breath, chest pain, abdominal pain, severe nausea/vomiting, or problems with urination or bowel movements unless otherwise stated above. Pertinent History Reviewed:  Reviewed past medical,surgical, social, obstetrical and family history.  Reviewed problem list, medications and allergies. Physical Assessment:   Vitals:   08/03/22 0848  BP: 114/74  Pulse: 93  Weight: 207 lb (93.9 kg)  Body mass index is 37.86 kg/m.        Physical Examination:   General appearance: Well appearing, and in no distress  Mental status: Alert, oriented to person, place, and time  Skin: Warm & dry  Cardiovascular: Normal heart rate noted  Respiratory: Normal respiratory effort, no distress  Abdomen: Soft, gravid, nontender  Pelvic: Cervical exam deferred         Extremities: Edema: None  Fetal Status: Fetal Heart Rate (bpm): 159 Fundal Height: 38 cm Movement: Present Presentation: Vertex  No results found for this or any previous visit (from the past 24 hour(s)).  Assessment & Plan:  1) Low-risk pregnancy G2P0010 at [redacted]w[redacted]d with an Estimated Date of Delivery: 08/22/22   2) Supervision of other normal pregnancy, antepartum - Labor precautions - Still planning waterbirth  3) [redacted] weeks gestation of pregnancy    Meds: No orders of the defined types were placed in this encounter.  Labs/procedures  today: none  Plan:  Continue routine obstetrical care   Reviewed: Term labor symptoms and general obstetric precautions including but not limited to vaginal bleeding, contractions, leaking of fluid and fetal movement were reviewed in detail with the patient.  All questions were answered. Has home bp cuff. Check bp weekly, let us know if >140/90.   Follow-up: Return in about 1 week (around 08/10/2022) for Return OB visit.  No orders of the defined types were placed in this encounter.  Raelyn Mora MSN, CNM 08/03/2022 9:11 AM

## 2022-08-07 ENCOUNTER — Encounter: Payer: Self-pay | Admitting: Certified Nurse Midwife

## 2022-08-07 ENCOUNTER — Ambulatory Visit: Payer: BC Managed Care – PPO | Admitting: Certified Nurse Midwife

## 2022-08-07 VITALS — BP 130/77 | HR 93 | Wt 211.4 lb

## 2022-08-07 DIAGNOSIS — Z3A37 37 weeks gestation of pregnancy: Secondary | ICD-10-CM

## 2022-08-07 DIAGNOSIS — Z3493 Encounter for supervision of normal pregnancy, unspecified, third trimester: Secondary | ICD-10-CM

## 2022-08-07 NOTE — Progress Notes (Signed)
   PRENATAL VISIT NOTE  Subjective:  Megan Vance is a 27 y.o. G2P0010 at [redacted]w[redacted]d being seen today for ongoing prenatal care.  She is currently monitored for the following issues for this low-risk pregnancy and has Bipolar disorder, current condition not specified as either manic or depressive (HCC); Supervision of other normal pregnancy, antepartum; and Hx of molar pregnancy, antepartum on their problem list.  Patient reports no complaints.  Contractions: Not present. Vag. Bleeding: None.  Movement: Present. Denies leaking of fluid.   The following portions of the patient's history were reviewed and updated as appropriate: allergies, current medications, past family history, past medical history, past social history, past surgical history and problem list.   Objective:   Vitals:   08/07/22 1539  BP: 130/77  Pulse: 93  Weight: 211 lb 6.4 oz (95.9 kg)    Fetal Status: Fetal Heart Rate (bpm): 147   Movement: Present     General:  Alert, oriented and cooperative. Patient is in no acute distress.  Skin: Skin is warm and dry. No rash noted.   Cardiovascular: Normal heart rate noted  Respiratory: Normal respiratory effort, no problems with respiration noted  Abdomen: Soft, gravid, appropriate for gestational age.  Pain/Pressure: Absent     Pelvic: Cervical exam deferred        Extremities: Normal range of motion.  Edema: None  Mental Status: Normal mood and affect. Normal behavior. Normal judgment and thought content.   Assessment and Plan:  Pregnancy: G2P0010 at [redacted]w[redacted]d 1. Encounter for supervision of low-risk pregnancy in third trimester - Patient overall doing well.  - She reports vigorous and frequent fetal movement.   2. [redacted] weeks gestation of pregnancy - Still planning Waterbirth. Reviewed WB and expectations with patient.  - Reviewed Patients birth Plan today. Request for delayed cord clamping up to 3 mins.    Term labor symptoms and general obstetric precautions including but  not limited to vaginal bleeding, contractions, leaking of fluid and fetal movement were reviewed in detail with the patient. Please refer to After Visit Summary for other counseling recommendations.   Return in about 1 week (around 08/14/2022) for LOB.  Future Appointments  Date Time Provider Department Center  08/16/2022  3:30 PM Raelyn Mora, CNM [facility] None  08/22/2022  3:30 PM Sue Lush, FNP [facility] None    Samar Venneman Danella Deis) Suzie Portela, MSN, CNM  [facility] for [facility]  08/07/2022 4:46 PM

## 2022-08-07 NOTE — Progress Notes (Signed)
Pt presents for ROB visit. Pt wants to discuss birth pain.

## 2022-08-16 ENCOUNTER — Ambulatory Visit (INDEPENDENT_AMBULATORY_CARE_PROVIDER_SITE_OTHER): Payer: BC Managed Care – PPO | Admitting: Obstetrics and Gynecology

## 2022-08-16 ENCOUNTER — Inpatient Hospital Stay (EMERGENCY_DEPARTMENT_HOSPITAL)
Admission: AD | Admit: 2022-08-16 | Discharge: 2022-08-16 | Disposition: A | Discharge status: Home / Self Care | Payer: BC Managed Care – PPO | Source: Home / Self Care | Attending: Obstetrics and Gynecology | Admitting: Obstetrics and Gynecology

## 2022-08-16 ENCOUNTER — Inpatient Hospital Stay (HOSPITAL_COMMUNITY): Payer: BC Managed Care – PPO | Admitting: Anesthesiology

## 2022-08-16 ENCOUNTER — Other Ambulatory Visit: Payer: Self-pay

## 2022-08-16 ENCOUNTER — Inpatient Hospital Stay (HOSPITAL_COMMUNITY)
Admission: AD | Admit: 2022-08-16 | Discharge: 2022-08-19 | DRG: 807 | Disposition: A | Discharge status: Home / Self Care | Payer: BC Managed Care – PPO | Attending: Obstetrics and Gynecology | Admitting: Obstetrics and Gynecology

## 2022-08-16 ENCOUNTER — Encounter (HOSPITAL_COMMUNITY): Payer: Self-pay | Admitting: Obstetrics and Gynecology

## 2022-08-16 DIAGNOSIS — O471 False labor at or after 37 completed weeks of gestation: Secondary | ICD-10-CM | POA: Insufficient documentation

## 2022-08-16 DIAGNOSIS — Z3493 Encounter for supervision of normal pregnancy, unspecified, third trimester: Secondary | ICD-10-CM

## 2022-08-16 DIAGNOSIS — Z87891 Personal history of nicotine dependence: Secondary | ICD-10-CM | POA: Diagnosis not present

## 2022-08-16 DIAGNOSIS — O99344 Other mental disorders complicating childbirth: Secondary | ICD-10-CM | POA: Diagnosis not present

## 2022-08-16 DIAGNOSIS — Z3480 Encounter for supervision of other normal pregnancy, unspecified trimester: Secondary | ICD-10-CM

## 2022-08-16 DIAGNOSIS — Z3A39 39 weeks gestation of pregnancy: Secondary | ICD-10-CM

## 2022-08-16 DIAGNOSIS — F319 Bipolar disorder, unspecified: Secondary | ICD-10-CM | POA: Diagnosis present

## 2022-08-16 DIAGNOSIS — O26893 Other specified pregnancy related conditions, third trimester: Secondary | ICD-10-CM | POA: Diagnosis present

## 2022-08-16 LAB — CBC
HCT: 30.7 % — ABNORMAL LOW (ref 36.0–46.0)
Hemoglobin: 9.2 g/dL — ABNORMAL LOW (ref 12.0–15.0)
MCH: 24.3 pg — ABNORMAL LOW (ref 26.0–34.0)
MCHC: 30 g/dL (ref 30.0–36.0)
MCV: 81.2 fL (ref 80.0–100.0)
Platelets: 277 10*3/uL (ref 150–400)
RBC: 3.78 MIL/uL — ABNORMAL LOW (ref 3.87–5.11)
RDW: 14.7 % (ref 11.5–15.5)
WBC: 10.6 10*3/uL — ABNORMAL HIGH (ref 4.0–10.5)
nRBC: 0 % (ref 0.0–0.2)

## 2022-08-16 LAB — TYPE AND SCREEN
ABO/RH(D): O POS
Antibody Screen: NEGATIVE

## 2022-08-16 MED ORDER — PHENYLEPHRINE 80 MCG/ML (10ML) SYRINGE FOR IV PUSH (FOR BLOOD PRESSURE SUPPORT)
80.0000 ug | PREFILLED_SYRINGE | INTRAVENOUS | Status: DC | PRN
  Filled 2022-08-16: qty 10

## 2022-08-16 MED ORDER — EPHEDRINE 5 MG/ML INJ: 10.0000 mg | INTRAVENOUS | Status: DC | PRN

## 2022-08-16 MED ORDER — DIPHENHYDRAMINE HCL 50 MG/ML IJ SOLN: 12.5000 mg | INTRAMUSCULAR | Status: DC | PRN

## 2022-08-16 MED ORDER — LACTATED RINGERS IV SOLN
500.0000 mL | Freq: Once | INTRAVENOUS | Status: AC
  Administered 2022-08-16: 500 mL via INTRAVENOUS

## 2022-08-16 MED ORDER — BUPIVACAINE HCL (PF) 0.25 % IJ SOLN
INTRAMUSCULAR | Status: DC | PRN
  Administered 2022-08-16: 1 mL via INTRATHECAL

## 2022-08-16 MED ORDER — OXYTOCIN-SODIUM CHLORIDE 30-0.9 UT/500ML-% IV SOLN
2.5000 [IU]/h | INTRAVENOUS | Status: DC
  Filled 2022-08-16: qty 500

## 2022-08-16 MED ORDER — FENTANYL CITRATE (PF) 100 MCG/2ML IJ SOLN
50.0000 ug | INTRAMUSCULAR | Status: DC | PRN
  Filled 2022-08-16: qty 2

## 2022-08-16 MED ORDER — FENTANYL-BUPIVACAINE-NACL 0.5-0.125-0.9 MG/250ML-% EP SOLN
12.0000 mL/h | EPIDURAL | Status: DC | PRN
  Filled 2022-08-16 (×3): qty 250

## 2022-08-16 MED ORDER — ACETAMINOPHEN 325 MG PO TABS
650.0000 mg | ORAL_TABLET | ORAL | Status: DC | PRN
  Administered 2022-08-17: 650 mg via ORAL
  Filled 2022-08-16: qty 2

## 2022-08-16 MED ORDER — PHENYLEPHRINE 80 MCG/ML (10ML) SYRINGE FOR IV PUSH (FOR BLOOD PRESSURE SUPPORT): 80.0000 ug | PREFILLED_SYRINGE | INTRAVENOUS | Status: DC | PRN

## 2022-08-16 MED ORDER — OXYCODONE-ACETAMINOPHEN 5-325 MG PO TABS: 2.0000 | ORAL_TABLET | ORAL | Status: DC | PRN

## 2022-08-16 MED ORDER — SOD CITRATE-CITRIC ACID 500-334 MG/5ML PO SOLN: 30.0000 mL | ORAL | Status: DC | PRN

## 2022-08-16 MED ORDER — FENTANYL-BUPIVACAINE-NACL 0.5-0.125-0.9 MG/250ML-% EP SOLN
EPIDURAL | Status: DC | PRN
  Administered 2022-08-16: 12 mL/h via EPIDURAL
  Administered 2022-08-17: 500 ug via EPIDURAL

## 2022-08-16 MED ORDER — LACTATED RINGERS IV SOLN
500.0000 mL | INTRAVENOUS | Status: DC | PRN
  Administered 2022-08-16: 500 mL via INTRAVENOUS

## 2022-08-16 MED ORDER — LACTATED RINGERS IV SOLN: INTRAVENOUS | Status: DC

## 2022-08-16 MED ORDER — ONDANSETRON HCL 4 MG/2ML IJ SOLN
4.0000 mg | Freq: Four times a day (QID) | INTRAMUSCULAR | Status: DC | PRN
  Administered 2022-08-17 (×2): 4 mg via INTRAVENOUS
  Filled 2022-08-16 (×2): qty 2

## 2022-08-16 MED ORDER — LIDOCAINE HCL (PF) 1 % IJ SOLN: 30.0000 mL | INTRAMUSCULAR | Status: DC | PRN

## 2022-08-16 MED ORDER — OXYCODONE-ACETAMINOPHEN 5-325 MG PO TABS: 1.0000 | ORAL_TABLET | ORAL | Status: DC | PRN

## 2022-08-16 MED ORDER — OXYTOCIN BOLUS FROM INFUSION
333.0000 mL | Freq: Once | INTRAVENOUS | Status: AC
  Administered 2022-08-17: 333 mL via INTRAVENOUS

## 2022-08-16 NOTE — H&P (Signed)
Megan Vance is a 27 y.o. female G2P0010 at [redacted]w[redacted]d presenting for active labor at term.  Pregnancy has been uncomplicated.     Nursing Staff Provider  Office Location [facility] Dating  LMP  [facility] Model Arly.Keller ] Traditional [ ]  Centering [ ]  Mom-Baby Dyad    Language  English Anatomy US  wnl  Flu Vaccine  Declined 1/26 Genetic/Carrier Screen  NIPS:   low risk female "[name]" AFP:   negative Horizon: negative x 4  TDaP Vaccine    Hgb A1C or  GTT Early  Third trimester normal  COVID Vaccine Vaccinated   LAB RESULTS   Rhogam  O/Positive/-- (01/30 1322)  Blood Type O/Positive/-- (01/30 1322)   Baby Feeding Plan Breast Antibody Negative (01/30 1322)  Contraception Declined Rubella 4.43 (01/30 1322)Immune  Circumcision Yes if a boy RPR Non Reactive (01/30 1322)   Pediatrician  Undecided HBsAg Negative (01/30 1322)   Support Person  HCVAb Non Reactive (01/30 1322)   Prenatal Classes  HIV Non Reactive (01/30 1322)     BTL Consent  GBS  NEGATIVE (For PCN allergy, check sensitivities)   VBAC Consent  Pap Diagnosis  Date Value Ref Range Status  01/01/2020   Final   - Negative for Intraepithelial Lesions or Malignancy (NILM)  01/01/2020 - Benign reactive/reparative changes  Final         DME Rx [X]  BP cuff Arly.Keller ] Weight Scale Waterbirth  [ ]  Class [ ]  Consent [ ]  CNM visit  PHQ9 & GAD7 [ X ] new OB [  ] 28 weeks  [  ] 36 weeks Induction  [ ]  Orders Entered [ ] Foley Y/N     OB History     Gravida  2   Para  0   Term  0   Preterm  0   AB  1   Living  0      SAB  0   IAB  0   Ectopic  0   Multiple  0   Live Births  0          Past Medical History:  Diagnosis Date   Bipolar affective disorder (HCC)    History of fainting    since age 83    Molar pregnancy    Past Surgical History:  Procedure Laterality Date   DILATION AND EVACUATION N/A 10/28/2019   Procedure: DILATATION AND EVACUATION;  Surgeon: Adam Phenix, MD;  Location: [facility];  Service:  Gynecology;  Laterality: N/A;  Confirmed with [name] in [place] on 10/26/19 that this patient is on the [place] schedule.  CS   OPERATIVE ULTRASOUND N/A 10/28/2019   Procedure: OPERATIVE ULTRASOUND;  Surgeon: Adam Phenix, MD;  Location: [facility];  Service: Gynecology;  Laterality: N/A;   Family History: family history includes Heart attack in her mother; Heart failure in her maternal grandmother; Kidney failure in her mother. Social History:  reports that she quit smoking about 10 years ago. Her smoking use included cigarettes. She has never used smokeless tobacco. She reports that she does not currently use alcohol. She reports that she does not currently use drugs after having used the following drugs: Marijuana.     Maternal Diabetes: No Genetic Screening: Normal Maternal Ultrasounds/Referrals: Normal Fetal Ultrasounds or other Referrals:  None Maternal Substance Abuse:  No Significant Maternal Medications:  None Significant Maternal Lab Results:  Group B Strep negative Number of Prenatal Visits:greater than 3 verified prenatal visits Other Comments:  None  Review of Systems  Constitutional:  Negative for chills, fatigue and fever.  Eyes:  Negative for visual disturbance.  Respiratory:  Negative for shortness of breath.   Cardiovascular:  Negative for chest pain.  Gastrointestinal:  Positive for abdominal pain. Negative for vomiting.  Genitourinary:  Negative for difficulty urinating, dysuria, flank pain, pelvic pain, vaginal bleeding, vaginal discharge and vaginal pain.  Musculoskeletal:  Positive for back pain.  Neurological:  Negative for dizziness and headaches.  Psychiatric/Behavioral: Negative.     Maternal Medical History:  Reason for admission: Contractions.   Contractions: Onset was 6-12 hours ago.   Frequency: regular.   Perceived severity is strong.   Fetal activity: Perceived fetal activity is normal.   Last perceived fetal movement was within the past  hour.   Prenatal complications: no prenatal complications Prenatal Complications - Diabetes: none.   Dilation: 6 Effacement (%): 100 Exam by:: Sharen Counter, CNM Blood pressure 131/62, pulse 88, last menstrual period 11/15/2021, unknown if currently breastfeeding. Maternal Exam:  Uterine Assessment: Contraction strength is moderate.  Contraction frequency is regular.  Abdomen: Fetal presentation: vertex Pelvis: adequate for delivery.   Cervix: Cervix evaluated by digital exam.     Fetal Exam Fetal Monitor Review: Mode: ultrasound.   Baseline rate: 135.  Variability: moderate (6-25 bpm).   Pattern: accelerations present.   Fetal State Assessment: Category I - tracings are normal.   Physical Exam Vitals and nursing note reviewed.  Constitutional:      Appearance: She is well-developed.  Cardiovascular:     Rate and Rhythm: Normal rate.  Pulmonary:     Effort: Pulmonary effort is normal.  Abdominal:     Palpations: Abdomen is soft.  Musculoskeletal:        General: Normal range of motion.     Cervical back: Normal range of motion.  Skin:    General: Skin is warm and dry.  Neurological:     Mental Status: She is alert and oriented to person, place, and time.  Psychiatric:        Behavior: Behavior normal.        Thought Content: Thought content normal.        Judgment: Judgment normal.     Prenatal labs: ABO, Rh: --/--/PENDING (08/01 1742) Antibody: PENDING (08/01 1742) Rubella: 4.43 (01/30 1322) RPR: Non Reactive (05/23 0950)  HBsAg: Negative (01/30 1322)  HIV: Non Reactive (05/23 0950)  GBS: Negative/-- (07/12 1208)   Assessment/Plan: 1. Normal labor   2. [redacted] weeks gestation of pregnancy     Admit to L&D Expectant management May labor in water as desired   Sharen Counter 08/16/2022, 7:03 PM

## 2022-08-16 NOTE — Progress Notes (Addendum)
Megan Vance is a 27 y.o. G2P0010 at [redacted]w[redacted]d by LMP admitted for active labor  Subjective: Patient getting comfortable with epidural placement   Objective: BP 126/67   Pulse 89   Temp 98.9 F (37.2 C) (Oral)   Resp 16   LMP 11/15/2021   SpO2 99%  No intake/output data recorded. No intake/output data recorded.  FHT:  FHR: 150 bpm, variability: moderate,  accelerations:  Present,  decelerations:  Present 2 lates following epidural placement that resolved with BPs coming back to baseline.  UC:   regular, every 2-5 minutes SVE:   Dilation: 6 Effacement (%): 90 Station: -1 Exam by:: Dorathy Daft CNM  Labs: Lab Results  Component Value Date   WBC 10.6 (H) 08/16/2022   HGB 9.2 (L) 08/16/2022   HCT 30.7 (L) 08/16/2022   MCV 81.2 08/16/2022   PLT 277 08/16/2022    Assessment / Plan: Spontaneous labor, progressing normally. Patient spontaneously ruptured in Labor tub. Clear fluid in Birth tub.   Labor: Progressing normally Fetal Wellbeing:  Category II Pain Control:  Epidural I/D:   GBS negative  Anticipated MOD:  NSVD  Transfer of Care to Dr. Ladon Applebaum @ 22:30.   Claudette Head, CNM 08/16/2022, 11:03 PM

## 2022-08-16 NOTE — Anesthesia Preprocedure Evaluation (Addendum)
Anesthesia Evaluation  Patient identified by MRN, date of birth, ID band Patient awake    Reviewed: Allergy & Precautions, Patient's Chart, lab work & pertinent test results  Airway Mallampati: II  TM Distance: >3 FB Neck ROM: Full    Dental no notable dental hx.    Pulmonary former smoker   Pulmonary exam normal breath sounds clear to auscultation       Cardiovascular negative cardio ROS Normal cardiovascular exam Rhythm:Regular Rate:Normal     Neuro/Psych  PSYCHIATRIC DISORDERS   Bipolar Disorder   negative neurological ROS     GI/Hepatic negative GI ROS, Neg liver ROS,,,  Endo/Other  BMI 39  Renal/GU negative Renal ROS  negative genitourinary   Musculoskeletal negative musculoskeletal ROS (+)    Abdominal   Peds negative pediatric ROS (+)  Hematology  (+) Blood dyscrasia, anemia Hb 9.2, plt 277   Anesthesia Other Findings   Reproductive/Obstetrics (+) Pregnancy                              Anesthesia Physical Anesthesia Plan  ASA: 3  Anesthesia Plan: Combined Spinal and Epidural   Post-op Pain Management:    Induction:   PONV Risk Score and Plan: 2  Airway Management Planned: Natural Airway  Additional Equipment: None  Intra-op Plan:   Post-operative Plan:   Informed Consent: I have reviewed the patients History and Physical, chart, labs and discussed the procedure including the risks, benefits and alternatives for the proposed anesthesia with the patient or authorized representative who has indicated his/her understanding and acceptance.       Plan Discussed with:   Anesthesia Plan Comments: (Originally doing water birth, now requesting epidural at 8cm dilated with urge to push, will proceed w/ CSE)        Anesthesia Quick Evaluation

## 2022-08-16 NOTE — Anesthesia Procedure Notes (Signed)
Epidural Patient location during procedure: OB Start time: 08/16/2022 10:14 PM End time: 08/16/2022 10:23 PM  Staffing Anesthesiologist: Lannie Fields, DO Performed: anesthesiologist   Preanesthetic Checklist Completed: patient identified, IV checked, risks and benefits discussed, monitors and equipment checked, pre-op evaluation and timeout performed  Epidural Patient position: sitting Prep: DuraPrep and site prepped and draped Patient monitoring: continuous pulse ox, blood pressure, heart rate and cardiac monitor Approach: midline Location: L3-L4 Injection technique: LOR air  Needle:  Needle type: Tuohy  Needle gauge: 17 G Needle length: 9 cm Needle insertion depth: 7 cm Catheter type: closed end flexible Catheter size: 19 Gauge Catheter at skin depth: 12 cm Test dose: negative  Assessment Sensory level: T8 Events: blood not aspirated, no cerebrospinal fluid, injection not painful, no injection resistance, no paresthesia and negative IV test  Additional Notes Patient identified. Risks/Benefits/Options discussed with patient including but not limited to bleeding, infection, nerve damage, paralysis, failed block, incomplete pain control, headache, blood pressure changes, nausea, vomiting, reactions to medication both or allergic, itching and postpartum back pain. Confirmed with bedside nurse the patient's most recent platelet count. Confirmed with patient that they are not currently taking any anticoagulation, have any bleeding history or any family history of bleeding disorders. Patient expressed understanding and wished to proceed. All questions were answered. Sterile technique was used throughout the entire procedure. Please see nursing notes for vital signs. Test dose was given through epidural catheter and negative prior to continuing to dose epidural or start infusion. Warning signs of high block given to the patient including shortness of breath, tingling/numbness in  hands, complete motor block, or any concerning symptoms with instructions to call for help. Patient was given instructions on fall risk and not to get out of bed. All questions and concerns addressed with instructions to call with any issues or inadequate analgesia.    CSE performed w/ 24G pencan through tuohy, clear CSF, no issues. Reason for block:procedure for pain

## 2022-08-16 NOTE — Plan of Care (Signed)

## 2022-08-16 NOTE — MAU Provider Note (Signed)
Event Date/Time   First Provider Initiated Contact with Patient 08/16/22 1758       S: Ms. Lorali Huckeba is a 27 y.o. G2P0010 at 102w1d  who presents to MAU today complaining contractions q 5 minutes since 4am. She denies vaginal bleeding. She denies LOF. She reports normal fetal movement.    O: LMP 11/15/2021  GENERAL: Well-developed, well-nourished female in no acute distress.  HEAD: Normocephalic, atraumatic.  CHEST: Normal effort of breathing, regular heart rate ABDOMEN: Soft, nontender, gravid  Cervical exam:  Dilation: 5 Effacement (%): 90 Exam by:: Dorathy Daft, CNM with BBOW  Limited exam, patient uncomfortable and check completed on her side.    Fetal Monitoring: Baseline: 145 Variability: moderate  Accelerations: present  Decelerations: early with contractions and quick return to baseline.  Contractions: every 3-5   A: SIUP at [redacted]w[redacted]d  Active labor  P: Admit to L&D for Spontaneous labor.   - CNM called and gave report to Dr. Camelia PhenesRobb Matar.   Carlynn Herald, CNM 08/16/2022 5:58 PM

## 2022-08-16 NOTE — MAU Note (Signed)
Megan Vance is a 27 y.o. at [redacted]w[redacted]d here in MAU reporting: she's having ctxs that are 5 minutes apart, reports began @ 0100 this morning.  Denies VB and LOF.  Endorses +FM. LMP: NA Onset of complaint: 0100 Pain score: 7 Vitals:   08/16/22 0734  BP: 135/75  Pulse: 93  Resp: 18  Temp: 98.1 F (36.7 C)  SpO2: 99%     FHT:145 bpm Lab orders placed from triage:   None

## 2022-08-16 NOTE — MAU Note (Signed)
..  Megan Vance is a 27 y.o. at [redacted]w[redacted]d here in MAU reporting: she was here this morning for labor check, was sent home at 1.5 cm and went into office today and was 3 cm and had a membrane sweep.  Reports bloody show. Reports some leaking of fluid on her pants, first noticed her pants were wet when she took them off to put gown on. +FM.

## 2022-08-16 NOTE — MAU Provider Note (Signed)
  S: Ms. Megan Vance is a 27 y.o. G2P0010 at [redacted]w[redacted]d  who presents to MAU today complaining contractions q 5-7 minutes since 0100. She denies vaginal bleeding. She denies LOF. She reports normal fetal movement.    O: BP 123/74 (BP Location: Right Arm)   Pulse 88   Temp 98.1 F (36.7 C) (Oral)   Resp 18   Ht 5\' 2"  (1.575 m)   Wt 96.5 kg   LMP 11/15/2021   SpO2 99%   BMI 38.90 kg/m   Cervical exam:  Dilation: 1.5 Effacement (%): Thick Station: Ballotable Presentation: Vertex Exam by:: Georgina Snell, RN   Fetal Monitoring: Baseline: 135bpm  Variability: moderate Accelerations: >2 15 X 15 accels Decelerations: no decels Contractions: every 5-7 mins  A: SIUP at [redacted]w[redacted]d  False labor  P: Discharge home with labor precautions   Sheppard Evens MD MPH OB Fellow, Faculty Practice [facility], Center for [facility] 08/16/2022

## 2022-08-16 NOTE — Progress Notes (Signed)
   LOW-RISK PREGNANCY OFFICE VISIT Patient name: Megan Vance MRN [id number]  Date of birth: 06/09/1995 Chief Complaint:   No chief complaint on file.  History of Present Illness:   Megan Vance is a 27 y.o. G2P0010 female at [redacted]w[redacted]d with an Estimated Date of Delivery: 08/22/22 being seen today for ongoing management of a low-risk pregnancy.  Today she reports contractions since midnight .She was seen in MAU earlier this morning and dilated 1.5 cm. She reports contractions are every 4-7 mins lasting 1 minute or longer. Vag. Bleeding: Bloody Show.   . denies leaking of fluid. Review of Systems:   Pertinent items are noted in HPI Denies abnormal vaginal discharge w/ itching/odor/irritation, headaches, visual changes, shortness of breath, chest pain, abdominal pain, severe nausea/vomiting, or problems with urination or bowel movements unless otherwise stated above. Pertinent History Reviewed:  Reviewed past medical,surgical, social, obstetrical and family history.  Reviewed problem list, medications and allergies. Physical Assessment:  There were no vitals filed for this visit.There is no height or weight on file to calculate BMI.        Physical Examination:   General appearance: Well appearing, and in no distress  Mental status: Alert, oriented to person, place, and time  Skin: Warm & dry  Cardiovascular: Normal heart rate noted  Respiratory: Normal respiratory effort, no distress  Abdomen: Soft, gravid, nontender  Pelvic: Cervical exam performed  Dilation: 4 Effacement (%): 90 Station: -1 Offered membrane sweeping, discussed r/b- pt decided to proceed, so membranes swept. Patient tolerated procedure well.  Extremities:    Fetal Status: Fetal Heart Rate (bpm): 150     Presentation: Vertex  No results found for this or any previous visit (from the past 24 hour(s)).  Assessment & Plan:  1) Low-risk pregnancy G2P0010 at [redacted]w[redacted]d with an Estimated Date of Delivery: 08/22/22   2) Encounter for  supervision of low-risk pregnancy in third trimester - Advised to go to MAU for labor evaluation - Message sent to Dorathy Daft, CNM and Sharen Counter, CNM to notify of patient being sent to MAU for labor evaluation  3) [redacted] weeks gestation of pregnancy     Meds: No orders of the defined types were placed in this encounter.  Labs/procedures today: cervical exam  Plan:  Continue routine obstetrical care   Reviewed: Term labor symptoms and general obstetric precautions including but not limited to vaginal bleeding, contractions, leaking of fluid and fetal movement were reviewed in detail with the patient.  All questions were answered. Has home bp cuff. Check bp weekly, let us know if >140/90.   Follow-up: No follow-ups on file.  No orders of the defined types were placed in this encounter.  Raelyn Mora MSN, CNM 08/16/2022 3:33 PM

## 2022-08-16 NOTE — Progress Notes (Addendum)
Megan Vance is a 27 y.o. G2P0010 at [redacted]w[redacted]d by LMP admitted for active labor  Subjective: Patient doing well. She is in the tub laboring well.   Objective: BP 131/62   Pulse 88   LMP 11/15/2021  No intake/output data recorded. No intake/output data recorded.  FHT: Dopplers with FHT 150's accelerations noted, no decelerations noted.  UC:   regular, every 2-3 minutes SVE:   Dilation: 8 Effacement (%): 100 Station: -1 Exam by:: Dorathy Daft CNM - very difficult exam in labor tub with patient position. Attempted both in lithotomy and knees. Patient not feeling the urge to bear down and FHT appropriate.   Labs: Lab Results  Component Value Date   WBC 10.6 (H) 08/16/2022   HGB 9.2 (L) 08/16/2022   HCT 30.7 (L) 08/16/2022   MCV 81.2 08/16/2022   PLT 277 08/16/2022    Assessment / Plan: Spontaneous labor, progressing normally  Labor: Progressing normally Fetal Wellbeing:   reassuring  Pain Control:  Water tub I/D:   GBS negative  Anticipated MOD:  NSVD  Claudette Head, CNM 08/16/2022, 9:39 PM

## 2022-08-17 ENCOUNTER — Encounter (HOSPITAL_COMMUNITY): Payer: Self-pay | Admitting: Obstetrics and Gynecology

## 2022-08-17 DIAGNOSIS — Z3A39 39 weeks gestation of pregnancy: Secondary | ICD-10-CM

## 2022-08-17 DIAGNOSIS — O99344 Other mental disorders complicating childbirth: Secondary | ICD-10-CM

## 2022-08-17 MED ORDER — SENNOSIDES-DOCUSATE SODIUM 8.6-50 MG PO TABS
2.0000 | ORAL_TABLET | ORAL | Status: DC
  Administered 2022-08-18 – 2022-08-19 (×2): 2 via ORAL
  Filled 2022-08-17 (×2): qty 2

## 2022-08-17 MED ORDER — IBUPROFEN 600 MG PO TABS
600.0000 mg | ORAL_TABLET | Freq: Four times a day (QID) | ORAL | Status: DC
  Administered 2022-08-17 – 2022-08-19 (×8): 600 mg via ORAL
  Filled 2022-08-17 (×8): qty 1

## 2022-08-17 MED ORDER — LIDOCAINE HCL URETHRAL/MUCOSAL 2 % EX GEL
1.0000 | Freq: Once | CUTANEOUS | Status: AC
  Administered 2022-08-17: 1 via URETHRAL
  Filled 2022-08-17: qty 6

## 2022-08-17 MED ORDER — LIDOCAINE-EPINEPHRINE (PF) 2 %-1:200000 IJ SOLN
INTRAMUSCULAR | Status: DC | PRN
  Administered 2022-08-17: 10 mL via EPIDURAL

## 2022-08-17 MED ORDER — FERROUS SULFATE 325 (65 FE) MG PO TABS
325.0000 mg | ORAL_TABLET | ORAL | Status: DC
  Administered 2022-08-18: 325 mg via ORAL
  Filled 2022-08-17: qty 1

## 2022-08-17 MED ORDER — BUPIVACAINE HCL (PF) 0.25 % IJ SOLN
INTRAMUSCULAR | Status: DC | PRN
Start: 2022-08-17 — End: 2022-08-17
  Administered 2022-08-17: 8 mL via EPIDURAL

## 2022-08-17 MED ORDER — COCONUT OIL OIL: 1.0000 | TOPICAL_OIL | Status: DC | PRN

## 2022-08-17 MED ORDER — ZOLPIDEM TARTRATE 5 MG PO TABS: 5.0000 mg | ORAL_TABLET | Freq: Every evening | ORAL | Status: DC | PRN

## 2022-08-17 MED ORDER — DIPHENHYDRAMINE HCL 25 MG PO CAPS: 25.0000 mg | ORAL_CAPSULE | Freq: Four times a day (QID) | ORAL | Status: DC | PRN

## 2022-08-17 MED ORDER — WITCH HAZEL-GLYCERIN EX PADS: 1.0000 | MEDICATED_PAD | CUTANEOUS | Status: DC | PRN

## 2022-08-17 MED ORDER — LIDOCAINE HCL (PF) 1 % IJ SOLN
INTRAMUSCULAR | Status: DC | PRN
  Administered 2022-08-17: 10 mL via EPIDURAL

## 2022-08-17 MED ORDER — SIMETHICONE 80 MG PO CHEW: 80.0000 mg | CHEWABLE_TABLET | ORAL | Status: DC | PRN

## 2022-08-17 MED ORDER — ONDANSETRON HCL 4 MG PO TABS: 4.0000 mg | ORAL_TABLET | ORAL | Status: DC | PRN

## 2022-08-17 MED ORDER — TERBUTALINE SULFATE 1 MG/ML IJ SOLN: 0.2500 mg | Freq: Once | INTRAMUSCULAR | Status: DC | PRN

## 2022-08-17 MED ORDER — BENZOCAINE-MENTHOL 20-0.5 % EX AERO
1.0000 | INHALATION_SPRAY | CUTANEOUS | Status: DC | PRN
  Administered 2022-08-17: 1 via TOPICAL
  Filled 2022-08-17: qty 56

## 2022-08-17 MED ORDER — TRANEXAMIC ACID-NACL 1000-0.7 MG/100ML-% IV SOLN
INTRAVENOUS | Status: AC
  Administered 2022-08-17: 1000 mg
  Filled 2022-08-17: qty 100

## 2022-08-17 MED ORDER — ACETAMINOPHEN 325 MG PO TABS
650.0000 mg | ORAL_TABLET | ORAL | Status: DC | PRN
  Administered 2022-08-17 – 2022-08-19 (×7): 650 mg via ORAL
  Filled 2022-08-17 (×7): qty 2

## 2022-08-17 MED ORDER — OXYTOCIN-SODIUM CHLORIDE 30-0.9 UT/500ML-% IV SOLN
1.0000 m[IU]/min | INTRAVENOUS | Status: DC
  Administered 2022-08-17: 10 m[IU]/min via INTRAVENOUS
  Administered 2022-08-17: 2 m[IU]/min via INTRAVENOUS
  Filled 2022-08-17: qty 500

## 2022-08-17 MED ORDER — DIBUCAINE (PERIANAL) 1 % EX OINT: 1.0000 | TOPICAL_OINTMENT | CUTANEOUS | Status: DC | PRN

## 2022-08-17 MED ORDER — FENTANYL CITRATE (PF) 100 MCG/2ML IJ SOLN
INTRAMUSCULAR | Status: DC | PRN
  Administered 2022-08-17: 100 ug via EPIDURAL

## 2022-08-17 MED ORDER — TETANUS-DIPHTH-ACELL PERTUSSIS 5-2.5-18.5 LF-MCG/0.5 IM SUSY: 0.5000 mL | PREFILLED_SYRINGE | Freq: Once | INTRAMUSCULAR | Status: DC

## 2022-08-17 MED ORDER — ONDANSETRON HCL 4 MG/2ML IJ SOLN: 4.0000 mg | INTRAMUSCULAR | Status: DC | PRN

## 2022-08-17 MED ORDER — PRENATAL MULTIVITAMIN CH
1.0000 | ORAL_TABLET | Freq: Every day | ORAL | Status: DC
  Administered 2022-08-18 – 2022-08-19 (×2): 1 via ORAL
  Filled 2022-08-17 (×2): qty 1

## 2022-08-17 NOTE — Progress Notes (Signed)
Labor Progress Note  Called to see patient - pt still 5/90/-1 now by two different examiners. Exam of 8cm thought to be erroneous due to difficulties with positioning in tub and patient discomfort. Pt was thought to have SROM sometime during her admission.   Pt declined a recheck by MD. Suspect pt has been 5cm since admission and is likely latently laboring. She continues to have severe pain despite epidural & redose. Anesthesia called and re-dosed for a second time. BSUS confirms cephalic presentation with LOT position.  FHT 130bpm, moderate, not accels, intermittent variable decels   Will return to recheck around 5-6a or sooner prn. If still 5cm, will discuss role for pitocin.   Harvie Bridge, MD Obstetrician & Gynecologist, [facility] for [employer], [facility]

## 2022-08-17 NOTE — Progress Notes (Signed)
Megan Vance is a 27 y.o. G2P0010 at [redacted]w[redacted]d by LMP admitted for labor   Subjective: Patient now comfortable with epidural. She is now resting s/p epidural replacement.   Objective: BP 122/72   Pulse 83   Temp 98.5 F (36.9 C) (Axillary)   Resp 17   LMP 11/15/2021   SpO2 99%  No intake/output data recorded. Total I/O In: -  Out: 250 [Urine:250]  FHT:  FHR: 135 bpm, variability: moderate,  accelerations:  Abscent,  decelerations:  Present Early Decels  UC:   irregular, every 3-7 minutes SVE:   Dilation: 5 Effacement (%): 80 Station: -1 Exam by:: Randall, RN  Labs: Lab Results  Component Value Date   WBC 10.6 (H) 08/16/2022   HGB 9.2 (L) 08/16/2022   HCT 30.7 (L) 08/16/2022   MCV 81.2 08/16/2022   PLT 277 08/16/2022    Assessment / Plan: Augmentation of labor. Patient agreeable to plan for Pit 2x2 with peanut ball.   Labor:  Initiate pit 2x2 and continue to titrate up as needed.  Fetal Wellbeing:  Category II Pain Control:  Epidural I/D:   GBS negative  Anticipated MOD:  NSVD  Claudette Head, CNM 08/17/2022, 10:59 AM

## 2022-08-17 NOTE — Progress Notes (Signed)
Megan Vance is a 27 y.o. G2P0010 at [redacted]w[redacted]d by LMP admitted for labor  Subjective: Patient feeling intermittent pressure with contractions.   Objective: BP 129/82   Pulse 94   Temp 97.6 F (36.4 C) (Oral)   Resp 17   LMP 11/15/2021   SpO2 99%  No intake/output data recorded. Total I/O In: -  Out: 375 [Urine:375]  FHT:  FHR: 135 bpm, variability: moderate,  accelerations:  Present,  decelerations:  Present intermittent variables and lates with contractions with quick return to baseline. Resolves with position changes.  UC:   irregular, every 1-7 minutes SVE:   Dilation: 10 Effacement (%): 100 Station: 0, Plus 1 Exam by:: Randall, RN  Labs: Lab Results  Component Value Date   WBC 10.6 (H) 08/16/2022   HGB 9.2 (L) 08/16/2022   HCT 30.7 (L) 08/16/2022   MCV 81.2 08/16/2022   PLT 277 08/16/2022    Assessment / Plan: Augmentation of labor, progressing well on pit.   Labor:  Suspicion of OP position. Encouraged frequent position changes to attempt to rotate baby. Continue to titrate Pit as needed. Patient to start pushing in 30 mins.   Fetal Wellbeing:  Category II Pain Control:  Epidural I/D:   GBS negative  Anticipated MOD:  NSVD  Megan Vance, CNM 08/17/2022, 3:30 PM   CNM to patient bedside, patient pushing well with great effort. Fetal position still OP and Variable decels with pushing with each contraction. Once Contraction is complete FHT returns to baseline. Suspicion for nuchal cord given tracing. Dr. Ashok Vance recommends IUPC placement for amnioinfusion at complete and pushing.  CNM attempted and IUPC returned anteriorly from the vagina. CNM Repositioned catheter and now tracing.Dr. Charlotta Vance to bedside to discuss vacuum delivery. Patient agreeable to plan of care. Please see MD note delivery details.   Megan Vance) Megan Portela, MSN, CNM  Center for [facility]  08/17/2022 5:17 PM

## 2022-08-17 NOTE — Discharge Summary (Shared)
Postpartum Discharge Summary  Date of Service updated***     Patient Name: Megan Vance DOB: [birth year] MRN: [id number]  Date of admission: 08/16/2022 Delivery date:08/17/2022 Delivering provider: Myna Hidalgo Date of discharge: 08/17/2022  Admitting diagnosis: Normal labor [O80, Z37.9] Intrauterine pregnancy: [redacted]w[redacted]d     Secondary diagnosis:  Principal Problem:   Normal labor  Additional problems: ***    Discharge diagnosis: Term Pregnancy Delivered                                              Post partum procedures:{Postpartum procedures:23558} Augmentation: Pitocin Complications: None  Hospital course: Onset of Labor With Vaginal Delivery      27 y.o. yo G2P0010 at [redacted]w[redacted]d was admitted in Latent Labor on 08/16/2022. Labor course was complicated by none  Membrane Rupture Time/Date: 8:20 PM,08/16/2022  Delivery Method:  Operative Delivery:Device used:Vacuum Indication: Fetal indications Episiotomy: None Lacerations:    Patient had a postpartum course complicated by ***.  She is ambulating, tolerating a regular diet, passing flatus, and urinating well. Patient is discharged home in stable condition on 08/17/22.  Newborn Data: Birth date:08/17/2022 Birth time:4:09 PM Gender:Female Living status:Living Apgars: ,  Weight:   Magnesium Sulfate received: {Mag received:30440022} BMZ received: No Rhophylac:N/A MMR:N/A T-DaP:Given prenatally Flu: No Transfusion:{Transfusion received:30440034}  Physical exam  Vitals:   08/17/22 1305 08/17/22 1400 08/17/22 1500 08/17/22 1530  BP: 117/72 112/62 129/82 (!) 103/55  Pulse: 98 80 94 89  Resp:      Temp:      TempSrc:      SpO2:       General: {Exam; general:21111117} Lochia: {Desc; appropriate/inappropriate:30686::"appropriate"} Uterine Fundus: {Desc; firm/soft:30687} Incision: {Exam; incision:21111123} DVT Evaluation: {Exam; dvt:2111122} Labs: Lab Results  Component Value Date   WBC 10.6 (H) 08/16/2022   HGB 9.2 (L) 08/16/2022    HCT 30.7 (L) 08/16/2022   MCV 81.2 08/16/2022   PLT 277 08/16/2022      Latest Ref Rng & Units 06/27/2022   11:03 PM  CMP  Glucose 70 - 99 mg/dL 86   BUN 6 - 20 mg/dL 6   Creatinine 4.09 - 8.11 mg/dL 9.14   Sodium 782 - 956 mmol/L 132   Potassium 3.5 - 5.1 mmol/L 3.2   Chloride 98 - 111 mmol/L 100   CO2 22 - 32 mmol/L 19   Calcium 8.9 - 10.3 mg/dL 9.0    Edinburgh Score:     No data to display           After visit meds:  Allergies as of 08/17/2022       Reactions   Tomato Anaphylaxis   Pineapple Other (See Comments)   Acid reflux     Med Rec must be completed prior to using this SMARTLINK***        Discharge home in stable condition Infant Feeding: {Baby feeding:23562} Infant Disposition:{CHL IP OB HOME WITH OZHYQM:57846} Discharge instruction: per After Visit Summary and Postpartum booklet. Activity: Advance as tolerated. Pelvic rest for 6 weeks.  Diet: {OB NGEX:52841324} Future Appointments: Future Appointments  Date Time Provider Department Center  08/22/2022  3:30 PM Sue Lush, FNP [facility] None   Follow up Visit: Message sent to [facility] 8/2  Please schedule this patient for a In person postpartum visit in 6 weeks with the following provider: Any provider. Additional Postpartum F/U:Postpartum Depression checkup  High risk pregnancy complicated  by:  BPD Delivery mode:    Anticipated Birth Control:   declined   08/17/2022 Myrtie Hawk, DO

## 2022-08-17 NOTE — Progress Notes (Signed)
Labor Progress Note  Delayed entry - saw patient around 3am to discuss possibility of CS. We discussed her continued issues with pain control as well as my recommendation for augmentation with pitocin. She reports that her pain is too out of control for pitocin and declines. We discussed anesthesia eval for re-dose and starting pitocin afterwards. She was willing to consider. Declined recheck at that time.   Returned to evaluate pt at 6am. SCE 5/80/-1. Reports pain improved but still 8 out of 10. Feeling exhausted and requests CS. We discussed that her exam has stalled at 5cm but does not meet criteria for arrest of dilation since she hasn't been 6+. We reviewed option for augmentation with pitocin to see if we can achieve cervical change or vaginal delivery. She does not feel she can tolerate the pain with pitocin. We reviewed cesarean delivery for prolonged latent labor/maternal exhaustion. Pt requests more pain control before proceeding with any option. Anesthesia called and are going to replace epidural to see if there is improvement.   FHT 130bpm, moderate variability, + accels, no decels  Will reevaluate after epidural is replaced.   Harvie Bridge, MD Obstetrician & Gynecologist, [facility] for [employer], [facility]

## 2022-08-17 NOTE — Anesthesia Procedure Notes (Signed)
Epidural Patient location during procedure: OB Start time: 08/17/2022 6:30 AM End time: 08/17/2022 6:40 AM  Staffing Anesthesiologist: Lannie Fields, DO Performed: anesthesiologist   Preanesthetic Checklist Completed: patient identified, IV checked, risks and benefits discussed, monitors and equipment checked, pre-op evaluation and timeout performed  Epidural Patient position: sitting Prep: DuraPrep and site prepped and draped Patient monitoring: continuous pulse ox, blood pressure, heart rate and cardiac monitor Approach: midline Location: L2-L3 Injection technique: LOR air  Needle:  Needle type: Tuohy  Needle gauge: 17 G Needle length: 9 cm Needle insertion depth: 6 cm Catheter type: closed end flexible Catheter size: 19 Gauge Catheter at skin depth: 11 cm Test dose: negative  Assessment Sensory level: T8 Events: blood not aspirated, no cerebrospinal fluid, injection not painful, no injection resistance, no paresthesia and negative IV test  Additional Notes Patient identified. Risks/Benefits/Options discussed with patient including but not limited to bleeding, infection, nerve damage, paralysis, failed block, incomplete pain control, headache, blood pressure changes, nausea, vomiting, reactions to medication both or allergic, itching and postpartum back pain. Confirmed with bedside nurse the patient's most recent platelet count. Confirmed with patient that they are not currently taking any anticoagulation, have any bleeding history or any family history of bleeding disorders. Patient expressed understanding and wished to proceed. All questions were answered. Sterile technique was used throughout the entire procedure. Please see nursing notes for vital signs. Test dose was given through epidural catheter and negative prior to continuing to dose epidural or start infusion. Warning signs of high block given to the patient including shortness of breath, tingling/numbness in  hands, complete motor block, or any concerning symptoms with instructions to call for help. Patient was given instructions on fall risk and not to get out of bed. All questions and concerns addressed with instructions to call with any issues or inadequate analgesia.  Reason for block:procedure for pain

## 2022-08-18 ENCOUNTER — Other Ambulatory Visit (HOSPITAL_BASED_OUTPATIENT_CLINIC_OR_DEPARTMENT_OTHER): Payer: Self-pay

## 2022-08-18 MED ORDER — IBUPROFEN 600 MG PO TABS
600.0000 mg | ORAL_TABLET | Freq: Four times a day (QID) | ORAL | 0 refills | Status: DC | PRN
  Filled 2022-08-18: qty 30, 8d supply, fill #0

## 2022-08-18 NOTE — Anesthesia Postprocedure Evaluation (Signed)
Anesthesia Post Note  Patient: Megan Vance  Procedure(s) Performed: AN AD HOC LABOR EPIDURAL     Patient location during evaluation: Mother Baby Anesthesia Type: Epidural Level of consciousness: awake and alert Pain management: pain level controlled Vital Signs Assessment: post-procedure vital signs reviewed and stable Respiratory status: spontaneous breathing, nonlabored ventilation and respiratory function stable Cardiovascular status: stable Postop Assessment: no headache, no backache, epidural receding and able to ambulate Anesthetic complications: no   No notable events documented.  Last Vitals:  Vitals:   08/18/22 0430 08/18/22 0821  BP: 110/66 119/75  Pulse: 88 85  Resp: 16 17  Temp: 36.9 C 36.6 C  SpO2: 100% 97%    Last Pain:  Vitals:   08/18/22 0821  TempSrc: Oral  PainSc: 0-No pain   Pain Goal: Patients Stated Pain Goal: 0 (08/17/22 0500)                 Robie Mcniel

## 2022-08-18 NOTE — Lactation Note (Signed)
This note was copied from a baby's chart. Lactation Consultation Note  Patient Name: Megan Vance [id number] Date: 08/18/2022  Age:27 hours Reason for consult: Primapara;1st time breastfeeding;Term  Mother has indicated that she does not want to see a Advertising copywriter. Will see mother if requested.  Consult Status Consult Status: Complete Date: 08/18/22    Omar Person 08/18/2022, 9:26 AM

## 2022-08-19 ENCOUNTER — Other Ambulatory Visit (HOSPITAL_BASED_OUTPATIENT_CLINIC_OR_DEPARTMENT_OTHER): Payer: Self-pay

## 2022-08-19 MED ORDER — SENNOSIDES-DOCUSATE SODIUM 8.6-50 MG PO TABS
2.0000 | ORAL_TABLET | ORAL | 1 refills | Status: AC
  Filled 2022-08-19: qty 30, 15d supply, fill #0
  Filled 2022-09-05: qty 30, 15d supply, fill #1

## 2022-08-19 MED ORDER — DOCUSATE SODIUM 100 MG PO CAPS
100.0000 mg | ORAL_CAPSULE | Freq: Two times a day (BID) | ORAL | 0 refills | Status: AC
  Filled 2022-08-19: qty 10, 5d supply, fill #0

## 2022-08-19 NOTE — Progress Notes (Signed)
Post Partum Day 2 of a Vacuum assisted vaginal delivery.  Subjective: no complaints, up ad lib, voiding, tolerating PO, and + flatus Patient has not had a bowel movement and is afraid of interfering with stitches.  Objective: Blood pressure 119/85, pulse 90, temperature 97.6 F (36.4 C), temperature source Oral, resp. rate 18, last menstrual period 11/15/2021, SpO2 100%, unknown if currently breastfeeding.  Physical Exam:  General: alert, cooperative, appears stated age, and no distress Lochia: appropriate Uterine Fundus: firm Incision: N.A  DVT Evaluation: No evidence of DVT seen on physical exam. No cords or calf tenderness. No significant calf/ankle edema.  Recent Labs    08/16/22 1759  HGB 9.2*  HCT 30.7*    Assessment/Plan: Discharge home, Breastfeeding, and Social Work consult given history of bi-polar disorder with new baby.  Encouraged the use of stool softeners after going home to help with ease of defecation. Patient verbalized understanding.     LOS: 3 days   Claudette Head, CNM 08/19/2022, 10:03 AM

## 2022-08-19 NOTE — Clinical Social Work Maternal (Signed)
CLINICAL SOCIAL WORK MATERNAL/CHILD NOTE  Patient Details  Name: Megan Vance MRN: [id number] Date of Birth:   Date:  [birth year]  Clinical Social Worker Initiating Note:  Albertine Patricia, LCSWA Date/Time: Initiated:  08/19/22/1652     Child's Name:  Megan Vance   Biological Parents:  Mother   Need for Interpreter:  None   Reason for Referral:  Behavioral Health Concerns   Address:  [address] [place] [address]    Phone number:  [phone] (home)     Additional phone number:   Household Members/Support Persons (HM/SP):       HM/SP Name Relationship DOB or Age  HM/SP -1        HM/SP -2        HM/SP -3        HM/SP -4        HM/SP -5        HM/SP -6        HM/SP -7        HM/SP -8          Natural Supports (not living in the home):  Immediate Family   Professional Supports: None   Employment: Full-time   Type of Work: Engineer, site Lay   Education:  Some Materials engineer arranged:    Architect:  Media planner , Medicaid   Other Resources:  Sales executive  , [facility]   Cultural/Religious Considerations Which May Impact Care:  Per [employer] chart review, MOB identifies as Curator.  Strengths:  Ability to meet basic needs  , Home prepared for child     Psychotropic Medications:         Pediatrician:     Undecided, has list.  Pediatrician List:   [facility]      Pediatrician Fax Number:    Risk Factors/Current Problems:  Mental Health Concerns     Cognitive State:  Able to Concentrate  , Alert  , Goal Oriented  , Linear Thinking     Mood/Affect:  Interested  , Comfortable  , Calm     CSW Assessment: CSW was consulted due to history of Bipolar Disorder. CSW met with MOB at bedside to complete assessment. When CSW entered room, MOB was observed sitting in hospital bed holding and bonding with infant. CSW introduced self and  explained reason for consult. MOB presented as calm, was agreeable to consult and remained engaged throughout encounter.   CSW confirmed MOB's demographic information on file. CSW inquired how MOB is feeling emotionally since infant's arrival. MOB states she is feeling "okay, just ready to go home." MOB reports she resides alone and identified her father as a support. MOB shares she is currently on short term disability through her employer. CSW inquired about MOB's mental health history. MOB confirms a diagnosis of Bipolar Disorder (unknown type), stating she was diagnosed with Bipolar Disorder, depression, and anxiety at age 27. CSW inquired about treatment. MOB states she does not take psychotropic medication, sharing that she was prescribed medications around the age of 27 but stopped taking them shortly after due to the medications making her feel like a "zombie." MOB states she is not current with a therapist. MOB shares she feels she can manage her mental health symptoms without medication/additional support at this time and declined additional outpatient mental health resources. CSW inquired about symptoms.  MOB reports during her pregnancy, she recalls endorsing feelings of anger towards people "being inconsiderate", but reports she overall felt "okay" during her pregnancy. CSW inquired about manic/hypermanic symptoms. MOB shares that there are times that she feels more "interactive" and other times she feels she wants to be more "to (her) self" but denied endosring periods of decreased need for sleep or increased energy during her pregnancy. MOB shares that her mental health symptoms are more marked by feelings of anger. MOB denies significant depressive symptoms during pregnancy. MOB denies a history of endorsing auditory or visual hallucinations. MOB denied current SI/HI/DV.   CSW provided education regarding the baby blues period vs. perinatal mood disorders, discussed treatment and gave resources  for mental health follow up if concerns arise.  CSW recommends self-evaluation during the postpartum time period using the New Mom Checklist from Postpartum Progress and encouraged MOB to contact a medical professional if symptoms are noted at any time.    MOB reports she has all needed items for infant, including a car seat and bassinet.   CSW provided review of Sudden Infant Death Syndrome (SIDS) precautions.    CSW identifies no further need for intervention and no barriers to discharge at this time.  CSW Plan/Description:  No Further Intervention Required/No Barriers to Discharge, Sudden Infant Death Syndrome (SIDS) Education, Perinatal Mood and Anxiety Disorder (PMADs) Education    Norberto Sorenson, LCSWA 07-21-22, 4:53 PM

## 2022-08-22 ENCOUNTER — Other Ambulatory Visit: Payer: Self-pay | Admitting: Emergency Medicine

## 2022-08-22 ENCOUNTER — Inpatient Hospital Stay (HOSPITAL_COMMUNITY): Admission: AD | Admit: 2022-08-22 | Payer: BC Managed Care – PPO | Source: Home / Self Care

## 2022-08-22 ENCOUNTER — Encounter: Payer: BC Managed Care – PPO | Admitting: Obstetrics and Gynecology

## 2022-08-31 ENCOUNTER — Ambulatory Visit (INDEPENDENT_AMBULATORY_CARE_PROVIDER_SITE_OTHER): Payer: BC Managed Care – PPO | Admitting: Licensed Clinical Social Worker

## 2022-08-31 DIAGNOSIS — Z8659 Personal history of other mental and behavioral disorders: Secondary | ICD-10-CM

## 2022-08-31 NOTE — BH Specialist Note (Signed)
Integrated Behavioral Health via Telemedicine Visit  08/31/2022 Megan Vance [id number]  Number of Integrated Behavioral Health Clinician visits: 1 Session Start time:  10:00am Session End time: 10:15am Total time in minutes: 15 mins via phone   Referring Provider: Pincus Badder CNM Patient/Family location: Home  [facility] Provider location: [facility]  All persons participating in visit: Megan Vance and LCSW A Felton Clinton  Types of Service: General Behavioral Integrated Care (BHI) and phone visit   I connected with Megan Vance and/or Megan Vance n/a via  Telephone or Video Enabled Telemedicine Application  (Video is Caregility application) and verified that I am speaking with the correct person using two identifiers. Discussed confidentiality: Yes   I discussed the limitations of telemedicine and the availability of in person appointments.  Discussed there is a possibility of technology failure and discussed alternative modes of communication if that failure occurs.  I discussed that engaging in this telemedicine visit, they consent to the provision of behavioral healthcare and the services will be billed under their insurance.  Patient and/or legal guardian expressed understanding and consented to Telemedicine visit: Yes   Presenting Concerns: Patient and/or family reports the following symptoms/concerns: hx of bipolar  Duration of problem: approx 6 months ; Severity of problem: mild  Patient and/or Family's Strengths/Protective Factors: Concrete supports in place (healthy food, safe environments, etc.)  Goals Addressed: Patient will:  Reduce symptoms of:  bipolar     Increase knowledge and/or ability of: coping skills   Demonstrate ability to: Increase healthy adjustment to current life circumstances  Progress towards Goals: Ongoing  Interventions: Interventions utilized:  Motivational Interviewing Standardized Assessments completed: Edinburgh Postnatal Depression  Patient and/or  Family Response: Ms. Mcconahy reports she is bonding well with newborn and does not have any concerns. Ms. Meine denies depressed/low mood, thoughts of harm, and loss of interest.   Assessment: Patient currently experiencing hx of bipolar .   Patient may benefit from integrated behavioral health.  Plan: Follow up with behavioral health clinician on : as needed  Behavioral recommendations: prioritize rest, communicate need for added support, incorporate self care routine in weekly schedule Referral(s): Integrated [employer] (In Clinic)  I discussed the assessment and treatment plan with the patient and/or parent/guardian. They were provided an opportunity to ask questions and all were answered. They agreed with the plan and demonstrated an understanding of the instructions.   They were advised to call back or seek an in-person evaluation if the symptoms worsen or if the condition fails to improve as anticipated.  Gwyndolyn Saxon, LCSW

## 2022-09-05 ENCOUNTER — Other Ambulatory Visit (HOSPITAL_BASED_OUTPATIENT_CLINIC_OR_DEPARTMENT_OTHER): Payer: Self-pay

## 2022-09-11 ENCOUNTER — Telehealth (HOSPITAL_COMMUNITY): Payer: Self-pay

## 2022-09-11 NOTE — Telephone Encounter (Signed)
09/11/2022 1334  Name: Megan Vance MRN: [id number] DOB: 03/28/1995  Reason for Call:  Transition of Care Hospital Discharge Call  Contact Status: Patient Contact Status: Complete  Language assistant needed: Interpreter Mode: Interpreter Not Needed        Follow-Up Questions: Do You Have Any Concerns About Your Health As You Heal From Delivery?: No Do You Have Any Concerns About Your Infants Health?: No  Edinburgh Postnatal Depression Scale:  In the Past 7 Days:    PHQ2-9 Depression Scale:     Discharge Follow-up: Edinburgh score requires follow up?:  (Patient declines wanting to do EPDS. She states that she is doing fine emotionally.)  Post-discharge interventions: Reviewed Newborn Safe Sleep Practices  Signature  Signe Colt

## 2022-10-11 ENCOUNTER — Ambulatory Visit (INDEPENDENT_AMBULATORY_CARE_PROVIDER_SITE_OTHER): Payer: BC Managed Care – PPO | Admitting: Obstetrics and Gynecology

## 2022-10-11 NOTE — Progress Notes (Signed)
Post Partum Visit Note  Megan Vance is a 27 y.o. G15P1011 female who presents for a postpartum visit. She is 8 weeks postpartum following a normal spontaneous vaginal delivery. I have fully reviewed the prenatal and intrapartum course. The delivery was at 39 gestational weeks.  Anesthesia: epidural. Postpartum course has been good. Baby, Megan Vance (pronounced Chosen), is doing well yes. Baby is feeding by breast. Bleeding no bleeding. Bowel function is normal. Bladder function is normal. Patient is not sexually active. Contraception method is none. Postpartum depression screening: negative.   The pregnancy intention screening data noted above was reviewed. Potential methods of contraception were discussed. The patient elected to proceed with no birth control.   Edinburgh Postnatal Depression Scale - 10/11/22 1041       Edinburgh Postnatal Depression Scale:  In the Past 7 Days   I have been able to laugh and see the funny side of things. 0    I have looked forward with enjoyment to things. 0    I have blamed myself unnecessarily when things went wrong. 0    I have been anxious or worried for no good reason. 0    I have felt scared or panicky for no good reason. 0    Things have been getting on top of me. 0    I have been so unhappy that I have had difficulty sleeping. 0    I have felt sad or miserable. 0    I have been so unhappy that I have been crying. 0    The thought of harming myself has occurred to me. 0    Edinburgh Postnatal Depression Scale Total 0             Health Maintenance Due  Topic Date Due   DTaP/Tdap/Td (1 - Tdap) Never done   INFLUENZA VACCINE  Never done   COVID-19 Vaccine (1 - 2023-24 season) Never done    The following portions of the patient's history were reviewed and updated as appropriate: allergies, current medications, past family history, past medical history, past social history, past surgical history, and problem list.  Review of  Systems Constitutional: negative Eyes: negative Ears, nose, mouth, throat, and face: negative Respiratory: negative Cardiovascular: negative Gastrointestinal: negative Genitourinary:negative Integument/breast: negative Hematologic/lymphatic: negative Musculoskeletal:negative Neurological: negative Behavioral/Psych: negative Endocrine: negative Allergic/Immunologic: negative  Objective:  BP 113/71   Pulse 82   Wt 176 lb 9.6 oz (80.1 kg)   LMP 11/15/2021   Breastfeeding Yes   BMI 32.30 kg/m    General:  alert, cooperative, and no distress   Breasts:  normal  Lungs: clear to auscultation bilaterally  Heart:  regular rate and rhythm, S1, S2 normal, no murmur, click, rub or gallop  Abdomen: soft, non-tender; bowel sounds normal; no masses,  no organomegaly   Wound N/a  GU exam:   deferred       Assessment:   Encounter for postpartum care of lactating mother - Normal postpartum exam.   Plan:   Essential components of care per ACOG recommendations:  1.  Mood and well being: Patient with negative depression screening today. Reviewed local resources for support.  - Patient tobacco use? No.   - hx of drug use? No.    2. Infant care and feeding:  -Patient currently breastmilk feeding? Yes. Discussed returning to work and pumping. Reviewed importance of draining breast regularly to support lactation.  -Social determinants of health (SDOH) reviewed in EPIC. No concerns  3. Sexuality, contraception and birth  spacing - Patient does not want a pregnancy in the next year.  Desired family size is 1 children.  - Reviewed reproductive life planning. Reviewed contraceptive methods based on pt preferences and effectiveness.  Patient desired No Method - Other Reason today.  She reports having lesbian sex and does not plan on having heterosexual sex "ever again." - Discussed birth spacing of 18 months  4. Sleep and fatigue -Encouraged family/partner/community support of 4 hrs of  uninterrupted sleep to help with mood and fatigue  5. Physical Recovery  - Discussed patients delivery and complications. She describes her labor as good. - Patient had a Vacuum Extraction Vaginal Delivery, no problems at delivery. Patient had a 2nd degree laceration. Perineal healing reviewed. Patient expressed understanding - Patient has urinary incontinence? No. - Patient is safe to resume physical and sexual activity  6.  Health Maintenance - HM due items addressed Yes - Last pap smear  Diagnosis  Date Value Ref Range Status  01/01/2020   Final   - Negative for Intraepithelial Lesions or Malignancy (NILM)  01/01/2020 - Benign reactive/reparative changes  Final   Pap smear not done at today's visit. Patient reports pap was done at the beginning of her pregnancy while she was still a patient of [facility] (~08/2021) -Breast Cancer screening indicated? No.   7. Chronic Disease/Pregnancy Condition follow up: None  - PCP follow up  Megan Vance, CNM Center for [employer], [facility]

## 2022-10-15 ENCOUNTER — Encounter: Payer: Self-pay | Admitting: Obstetrics and Gynecology

## 2022-11-29 ENCOUNTER — Encounter: Payer: Self-pay | Admitting: Obstetrics and Gynecology

## 2023-02-20 ENCOUNTER — Emergency Department (HOSPITAL_COMMUNITY)
Admission: EM | Admit: 2023-02-20 | Discharge: 2023-02-20 | Disposition: A | Discharge status: Home / Self Care | Payer: Medicaid Other | Attending: Emergency Medicine | Admitting: Emergency Medicine

## 2023-02-20 ENCOUNTER — Other Ambulatory Visit: Payer: Self-pay

## 2023-02-20 ENCOUNTER — Other Ambulatory Visit (HOSPITAL_BASED_OUTPATIENT_CLINIC_OR_DEPARTMENT_OTHER): Payer: Self-pay

## 2023-02-20 DIAGNOSIS — J039 Acute tonsillitis, unspecified: Secondary | ICD-10-CM | POA: Diagnosis not present

## 2023-02-20 DIAGNOSIS — H66001 Acute suppurative otitis media without spontaneous rupture of ear drum, right ear: Secondary | ICD-10-CM | POA: Insufficient documentation

## 2023-02-20 DIAGNOSIS — J029 Acute pharyngitis, unspecified: Secondary | ICD-10-CM | POA: Diagnosis present

## 2023-02-20 MED ORDER — AMOXICILLIN 500 MG PO CAPS
500.0000 mg | ORAL_CAPSULE | Freq: Once | ORAL | Status: AC
  Administered 2023-02-20: 500 mg via ORAL
  Filled 2023-02-20: qty 1

## 2023-02-20 MED ORDER — AMOXICILLIN 500 MG PO CAPS
500.0000 mg | ORAL_CAPSULE | Freq: Three times a day (TID) | ORAL | 0 refills | Status: AC
  Filled 2023-02-20: qty 30, 10d supply, fill #0

## 2023-02-20 MED ORDER — IBUPROFEN 800 MG PO TABS
800.0000 mg | ORAL_TABLET | Freq: Three times a day (TID) | ORAL | 0 refills | Status: AC | PRN
  Filled 2023-02-20: qty 30, 10d supply, fill #0

## 2023-02-20 NOTE — Discharge Instructions (Addendum)
Return if any problems. See your Physician for recheck if symptoms persist  

## 2023-02-20 NOTE — ED Provider Notes (Signed)
 [place] [facility] AT [facility] Provider Note   CSN: [id number] Arrival date & time: 02/20/23  9171     History  Chief Complaint  Patient presents with   Sore Throat   Otalgia    Megan Vance is a 28 y.o. female.  Pt complains of a sore throat and left ear pain.  Pt has been using a nasal spray.  Pt had a evisit.  Patient reports she has a child who recently had an ear infection.  Patient reports her child had a sore throat as well.  Patient denies any nausea vomiting or cough.  Patient currently is afebrile.  Past medical history patient is currently breast-feeding.  She is not allergic to any medic patients.   Sore Throat  Otalgia      Home Medications Prior to Admission medications   Medication Sig Start Date End Date Taking? Authorizing Provider  amoxicillin  (AMOXIL ) 500 MG capsule Take 1 capsule (500 mg total) by mouth 3 (three) times daily. 02/20/23  Yes Xena Propst K, PA-C  ibuprofen  (ADVIL ) 800 MG tablet Take 1 tablet (800 mg total) by mouth every 8 (eight) hours as needed. 02/20/23  Yes Caroline Matters K, PA-C  docusate sodium  (COLACE) 100 MG capsule Take 1 capsule (100 mg total) by mouth 2 (two) times daily. Patient not taking: Reported on 10/11/2022 08/19/22   Emilio Delilah HERO, CNM  Prenatal Vit-Fe Fumarate-FA (PRENATAL MULTIVITAMIN) TABS tablet Take 1 tablet by mouth daily at 12 noon.    [provider]  senna-docusate (SENOKOT-S) 8.6-50 MG tablet Take 2 tablets by mouth daily. Patient not taking: Reported on 10/11/2022 08/19/22   Emilio Delilah HERO, CNM      Allergies    Tomato and Pineapple    Review of Systems   Review of Systems  HENT:  Positive for ear pain.   All other systems reviewed and are negative.   Physical Exam Updated Vital Signs BP 135/89 (BP Location: Right Arm)   Pulse 88   Temp 98.9 F (37.2 C) (Oral)   Resp 17   SpO2 99%  Physical Exam Vitals and nursing note reviewed.  Constitutional:       Appearance: She is well-developed.  HENT:     Head: Normocephalic.     Right Ear: Tympanic membrane is erythematous.     Left Ear: Tympanic membrane normal.     Mouth/Throat:     Mouth: Mucous membranes are moist.  Cardiovascular:     Rate and Rhythm: Normal rate.  Pulmonary:     Effort: Pulmonary effort is normal.  Abdominal:     General: There is no distension.  Musculoskeletal:        General: Normal range of motion.     Cervical back: Normal range of motion.  Skin:    General: Skin is warm.  Neurological:     General: No focal deficit present.     Mental Status: She is alert and oriented to person, place, and time.     ED Results / Procedures / Treatments   Labs (all labs ordered are listed, but only abnormal results are displayed) Labs Reviewed - No data to display  EKG None  Radiology No results found.  Procedures Procedures    Medications Ordered in ED Medications  amoxicillin  (AMOXIL ) capsule 500 mg (has no administration in time range)    ED Course/ Medical Decision Making/ A&P  Medical Decision Making Patient complains of lower throat and left earache.  Risk Prescription drug management. Risk Details: Patient has had a sore throat with redness for over a week.  Patient has a left otitis media.  Patient given a prescription for amoxicillin  and ibuprofen .  I advised follow-up with primary care physician for recheck.           Final Clinical Impression(s) / ED Diagnoses Final diagnoses:  Non-recurrent acute suppurative otitis media of right ear without spontaneous rupture of tympanic membrane  Tonsillitis    Rx / DC Orders ED Discharge Orders          Ordered    amoxicillin  (AMOXIL ) 500 MG capsule  3 times daily        02/20/23 0859    ibuprofen  (ADVIL ) 800 MG tablet  Every 8 hours PRN        02/20/23 0859           An After Visit Summary was printed and given to the patient.    Flint Sonny POUR, PA-C 02/20/23 9094    Francesca Elsie CROME, MD 02/20/23 [phone]

## 2023-02-20 NOTE — ED Triage Notes (Signed)
 Pt. Stated Megan Vance had a sore throat and ear pain for 1 1/2 weeks. I've had 2 virtual visits and they just gave my stuff to put up my nose.

## 2023-06-29 NOTE — Progress Notes (Signed)
 Subjective Patient ID: Megan Vance is a 28 y.o. female.    Megan Vance is a 28 y.o. female with no significant medical history to [facility] for worsening sore throat x 2 wks. Rates pain 8/10 and describes as soreness and aching. Verbalizes having no fever, but increased pain with swallowing. Denies taking any medications for symptoms. States that she was delayed in getting treatment due to decreased free time as a single mother.     Review of Systems  Constitutional:  Negative for chills, fatigue and fever.  HENT:  Positive for ear pain and sore throat. Negative for congestion, drooling, ear discharge, facial swelling, hearing loss, postnasal drip, rhinorrhea, sinus pressure, sinus pain, sneezing and trouble swallowing.   Eyes:  Negative for discharge, redness and itching.  Respiratory:  Negative for cough, chest tightness, shortness of breath and wheezing.   Cardiovascular:  Negative for chest pain.  Gastrointestinal:  Negative for abdominal pain, nausea and vomiting.  Musculoskeletal:  Negative for arthralgias and myalgias.  Skin:  Negative for rash.  Allergic/Immunologic: Negative for environmental allergies.  Neurological:  Negative for dizziness and headaches.  Hematological:  Negative for adenopathy.  Psychiatric/Behavioral:  Negative for sleep disturbance.     Patient History  Allergies: No Known Allergies  History reviewed. No pertinent past medical history. History reviewed. No pertinent surgical history. Social History   Socioeconomic History  . Marital status: Single    Spouse name: Not on file  . Number of children: Not on file  . Years of education: Not on file  . Highest education level: Not on file  Occupational History  . Not on file  Tobacco Use  . Smoking status: Never  . Smokeless tobacco: Never  Substance and Sexual Activity  . Alcohol use: Yes  . Drug use: Not on file  . Sexual activity: Not on file  Other Topics Concern  . Not on file  Social  History Narrative  . Not on file   History reviewed. No pertinent family history. No current outpatient medications on file prior to visit.   No current facility-administered medications on file prior to visit.    Objective  Vitals:   06/29/23 1154  BP: 116/75  Pulse: 70  Resp: 18  Temp: 37.4 C (99.4 F)  TempSrc: Oral  SpO2: 99%  Weight: 66.6 kg  Height: 5' 2  PainSc: 10-Worst pain ever  LMP date is Unknown     OBGYN/Pregnancy Status: Recent pregnancy breastfeeding     No results found.  Physical Exam Vitals and nursing note reviewed.  Constitutional:      General: She is not in acute distress.    Appearance: She is ill-appearing. She is not toxic-appearing or diaphoretic.  HENT:     Head: Normocephalic and atraumatic.     Right Ear: Tympanic membrane, ear canal and external ear normal. No swelling or tenderness. There is no impacted cerumen. Tympanic membrane is not perforated, erythematous, retracted or bulging.     Left Ear: Ear canal and external ear normal. No swelling or tenderness. There is no impacted cerumen. Tympanic membrane is erythematous. Tympanic membrane is not perforated, retracted or bulging.     Nose: Nose normal. No congestion or rhinorrhea.     Right Sinus: No maxillary sinus tenderness or frontal sinus tenderness.     Left Sinus: No maxillary sinus tenderness or frontal sinus tenderness.     Mouth/Throat:     Pharynx: Oropharynx is clear. Uvula midline. Posterior oropharyngeal erythema present. No pharyngeal swelling,  oropharyngeal exudate or uvula swelling.     Tonsils: Tonsillar exudate present. 2+ on the right. 2+ on the left.     Comments: Pustules noted to bilateral tonsils Eyes:     General: No scleral icterus.       Right eye: No discharge.        Left eye: No discharge.     Extraocular Movements: Extraocular movements intact.     Conjunctiva/sclera: Conjunctivae normal.     Pupils: Pupils are equal, round, and reactive to light.   Cardiovascular:     Rate and Rhythm: Normal rate and regular rhythm.     Pulses: Normal pulses.     Heart sounds: Normal heart sounds. No murmur heard. Pulmonary:     Effort: Pulmonary effort is normal. No respiratory distress.     Breath sounds: Normal breath sounds. No decreased breath sounds, wheezing, rhonchi or rales.  Musculoskeletal:     Cervical back: Normal range of motion and neck supple.  Lymphadenopathy:     Head:     Right side of head: No submandibular, preauricular or posterior auricular adenopathy.     Left side of head: No submandibular, preauricular or posterior auricular adenopathy.     Cervical: No cervical adenopathy.  Skin:    General: Skin is warm and dry.     Capillary Refill: Capillary refill takes less than 2 seconds.  Neurological:     General: No focal deficit present.     Mental Status: She is alert and oriented to person, place, and time.  Psychiatric:        Mood and Affect: Mood normal.        Behavior: Behavior normal.     Results for orders placed or performed in visit on 06/29/23  POCT rapid strep A manually resulted  Component Result   Rapid Strep A Screen Positive (A)   Internal Quality Control Pass       Procedures MDM:     1 Acute illness with systemic symptoms     Unique ordered tests: One     Assessment requiring historian other than patient: No     Independent visualization of image, tracing, or test: No     Discussion of management with another provider: No     Risk:: Moderate            Assessment/Plan Diagnoses and all orders for this visit:  Strep throat -     POCT rapid strep A manually resulted -     amoxicillin -clavulanate (Augmentin) 875-125 MG tablet; Take 1 tablet by mouth in the morning and 1 tablet in the evening. Do all this for 7 days.  Other non-recurrent acute nonsuppurative otitis media of left ear -     amoxicillin -clavulanate (Augmentin) 875-125 MG tablet; Take 1 tablet by mouth in the morning  and 1 tablet in the evening. Do all this for 7 days.     Disposition Status: Home  There are no Patient Instructions on file for this visit.  Progress note signed by Christain Asp, NP on 06/29/23 at  2:52 PM
